# Patient Record
Sex: Female | Born: 1935 | Race: White | Hispanic: No | Marital: Married | Smoking: Never smoker
Health system: Southern US, Community
[De-identification: ages and names within clinical notes are randomized; demographics above are authoritative.]

## PROBLEM LIST (undated history)

## (undated) DIAGNOSIS — R310 Gross hematuria: Secondary | ICD-10-CM

## (undated) DIAGNOSIS — J449 Chronic obstructive pulmonary disease, unspecified: Secondary | ICD-10-CM

## (undated) DIAGNOSIS — R32 Unspecified urinary incontinence: Secondary | ICD-10-CM

## (undated) DIAGNOSIS — Z9889 Other specified postprocedural states: Secondary | ICD-10-CM

## (undated) DIAGNOSIS — R351 Nocturia: Secondary | ICD-10-CM

## (undated) DIAGNOSIS — E785 Hyperlipidemia, unspecified: Secondary | ICD-10-CM

## (undated) DIAGNOSIS — R350 Frequency of micturition: Secondary | ICD-10-CM

## (undated) DIAGNOSIS — Z8719 Personal history of other diseases of the digestive system: Secondary | ICD-10-CM

## (undated) DIAGNOSIS — E119 Type 2 diabetes mellitus without complications: Secondary | ICD-10-CM

## (undated) DIAGNOSIS — Z8554 Personal history of malignant neoplasm of ureter: Secondary | ICD-10-CM

## (undated) DIAGNOSIS — Z8551 Personal history of malignant neoplasm of bladder: Secondary | ICD-10-CM

## (undated) DIAGNOSIS — I10 Essential (primary) hypertension: Secondary | ICD-10-CM

## (undated) DIAGNOSIS — E039 Hypothyroidism, unspecified: Secondary | ICD-10-CM

## (undated) HISTORY — PX: OTHER SURGICAL HISTORY: SHX169

## (undated) HISTORY — PX: CATARACT EXTRACTION W/ INTRAOCULAR LENS  IMPLANT, BILATERAL: SHX1307

---

## 1998-04-17 ENCOUNTER — Ambulatory Visit (HOSPITAL_COMMUNITY): Admission: RE | Admit: 1998-04-17 | Discharge: 1998-04-17 | Payer: Self-pay | Admitting: *Deleted

## 1998-04-17 ENCOUNTER — Encounter: Payer: Self-pay | Admitting: *Deleted

## 1998-04-23 ENCOUNTER — Ambulatory Visit (HOSPITAL_COMMUNITY): Admission: RE | Admit: 1998-04-23 | Discharge: 1998-04-23 | Payer: Self-pay | Admitting: *Deleted

## 1998-04-23 ENCOUNTER — Encounter: Payer: Self-pay | Admitting: *Deleted

## 2001-01-09 ENCOUNTER — Ambulatory Visit (HOSPITAL_COMMUNITY): Admission: RE | Admit: 2001-01-09 | Discharge: 2001-01-09 | Payer: Self-pay | Admitting: General Surgery

## 2001-05-30 ENCOUNTER — Ambulatory Visit (HOSPITAL_COMMUNITY): Admission: RE | Admit: 2001-05-30 | Discharge: 2001-05-30 | Payer: Self-pay | Admitting: Ophthalmology

## 2001-06-23 ENCOUNTER — Ambulatory Visit (HOSPITAL_COMMUNITY): Admission: RE | Admit: 2001-06-23 | Discharge: 2001-06-23 | Payer: Self-pay | Admitting: Internal Medicine

## 2001-06-23 ENCOUNTER — Encounter: Payer: Self-pay | Admitting: Internal Medicine

## 2001-07-11 ENCOUNTER — Ambulatory Visit (HOSPITAL_COMMUNITY): Admission: RE | Admit: 2001-07-11 | Discharge: 2001-07-11 | Payer: Self-pay | Admitting: Ophthalmology

## 2002-06-25 ENCOUNTER — Ambulatory Visit (HOSPITAL_COMMUNITY): Admission: RE | Admit: 2002-06-25 | Discharge: 2002-06-25 | Payer: Self-pay | Admitting: Internal Medicine

## 2002-06-25 ENCOUNTER — Encounter: Payer: Self-pay | Admitting: Internal Medicine

## 2003-07-15 ENCOUNTER — Ambulatory Visit (HOSPITAL_COMMUNITY): Admission: RE | Admit: 2003-07-15 | Discharge: 2003-07-15 | Payer: Self-pay | Admitting: Internal Medicine

## 2004-07-29 ENCOUNTER — Ambulatory Visit (HOSPITAL_COMMUNITY): Admission: RE | Admit: 2004-07-29 | Discharge: 2004-07-29 | Payer: Self-pay | Admitting: Internal Medicine

## 2005-07-30 ENCOUNTER — Ambulatory Visit (HOSPITAL_COMMUNITY): Admission: RE | Admit: 2005-07-30 | Discharge: 2005-07-30 | Payer: Self-pay | Admitting: Internal Medicine

## 2006-08-02 ENCOUNTER — Ambulatory Visit (HOSPITAL_COMMUNITY): Admission: RE | Admit: 2006-08-02 | Discharge: 2006-08-02 | Payer: Self-pay | Admitting: Internal Medicine

## 2006-11-08 ENCOUNTER — Ambulatory Visit (HOSPITAL_COMMUNITY): Admission: RE | Admit: 2006-11-08 | Discharge: 2006-11-08 | Payer: Self-pay | Admitting: General Surgery

## 2006-11-08 ENCOUNTER — Encounter (INDEPENDENT_AMBULATORY_CARE_PROVIDER_SITE_OTHER): Payer: Self-pay | Admitting: General Surgery

## 2007-08-03 ENCOUNTER — Ambulatory Visit (HOSPITAL_COMMUNITY): Admission: RE | Admit: 2007-08-03 | Discharge: 2007-08-03 | Payer: Self-pay | Admitting: Internal Medicine

## 2007-08-14 ENCOUNTER — Ambulatory Visit (HOSPITAL_COMMUNITY): Admission: RE | Admit: 2007-08-14 | Discharge: 2007-08-14 | Payer: Self-pay | Admitting: Internal Medicine

## 2008-08-14 ENCOUNTER — Ambulatory Visit (HOSPITAL_COMMUNITY): Admission: RE | Admit: 2008-08-14 | Discharge: 2008-08-14 | Payer: Self-pay | Admitting: Internal Medicine

## 2009-08-18 ENCOUNTER — Ambulatory Visit (HOSPITAL_COMMUNITY)
Admission: RE | Admit: 2009-08-18 | Discharge: 2009-08-18 | Payer: Self-pay | Source: Home / Self Care | Admitting: Internal Medicine

## 2010-04-28 ENCOUNTER — Ambulatory Visit (HOSPITAL_COMMUNITY)
Admission: RE | Admit: 2010-04-28 | Discharge: 2010-04-28 | Payer: Self-pay | Source: Home / Self Care | Attending: Internal Medicine | Admitting: Internal Medicine

## 2010-05-01 ENCOUNTER — Ambulatory Visit
Admission: RE | Admit: 2010-05-01 | Discharge: 2010-05-01 | Payer: Self-pay | Source: Home / Self Care | Attending: Urology | Admitting: Urology

## 2010-05-19 ENCOUNTER — Other Ambulatory Visit: Payer: Self-pay | Admitting: Urology

## 2010-05-19 ENCOUNTER — Ambulatory Visit (HOSPITAL_COMMUNITY)
Admission: RE | Admit: 2010-05-19 | Discharge: 2010-05-19 | Disposition: A | Discharge status: Home / Self Care | Payer: Medicare Other | Source: Ambulatory Visit | Attending: Urology | Admitting: Urology

## 2010-05-19 ENCOUNTER — Ambulatory Visit (HOSPITAL_BASED_OUTPATIENT_CLINIC_OR_DEPARTMENT_OTHER)
Admission: RE | Admit: 2010-05-19 | Discharge: 2010-05-19 | Disposition: A | Discharge status: Home / Self Care | Payer: Medicare Other | Source: Ambulatory Visit | Attending: Urology | Admitting: Urology

## 2010-05-19 DIAGNOSIS — C669 Malignant neoplasm of unspecified ureter: Secondary | ICD-10-CM | POA: Insufficient documentation

## 2010-05-19 DIAGNOSIS — Z01812 Encounter for preprocedural laboratory examination: Secondary | ICD-10-CM | POA: Insufficient documentation

## 2010-05-19 DIAGNOSIS — Z01818 Encounter for other preprocedural examination: Secondary | ICD-10-CM

## 2010-05-19 DIAGNOSIS — Z01810 Encounter for preprocedural cardiovascular examination: Secondary | ICD-10-CM | POA: Insufficient documentation

## 2010-05-19 HISTORY — PX: OTHER SURGICAL HISTORY: SHX169

## 2010-05-19 LAB — POCT I-STAT 4, (NA,K, GLUC, HGB,HCT): Glucose, Bld: 126 mg/dL — ABNORMAL HIGH (ref 70–99)

## 2010-05-20 NOTE — Op Note (Addendum)
NAMECAMIRA, [name]                  ACCOUNT NO.:  [id number]  MEDICAL RECORD NO.:  [id number]           PATIENT TYPE:  O  LOCATION:  XRAY                         FACILITY:  [facility]  PHYSICIAN:  Excell Seltzer. Annabell Howells, M.D.    DATE OF BIRTH:  Dec 29, 1922  DATE OF PROCEDURE:  05/19/2010 DATE OF DISCHARGE:                              OPERATIVE REPORT   PROCEDURE:  Cystoscopy, left retrograde pyelogram with interpretation, left ureteroscopy with biopsy and fulguration of 2-cm left distal ureteral tumor, aspiration cytology from the left renal pelvis, placement of a left double-J stent.  SURGEON:  Excell Seltzer. Annabell Howells, M.D.  ANESTHESIA:  MAC.  FINDINGS:  A 2-cm papillary left distal ureteral tumor with a normal left renal retrograde pyelogram.  SPECIMEN:  Left renal cytology and left distal tumor fragments sent to Pathology.  ESTIMATED BLOOD LOSS:  Minimal.  DRAINS:  6-French 24-cm double-J stent.  COMPLICATIONS:  None.  INDICATIONS:  [name] is an 77 year old white female who was evaluated for hematuria and was found to have a left distal ureteral filling defect, worrisome for clot or tumor.  The bleeding has stopped, but she still needs evaluation.  FINDINGS AND PROCEDURE:  She was taken to the operating room where she was given Cipro and fitted with PAS hose.  Latex precautions were enacted.  Moderate anesthesia care was induced.  She was placed in the lithotomy position.  Her perineum and genitalia were prepped with Betadine solution.  She was draped in the usual sterile fashion.  Time- out was performed.  The urethra was instilled with 2% lidocaine jelly. Cystoscopy was performed using the 22-French scope and 12-degree and 70- degrees lenses.  Examination revealed a normal urethra.  The bladder wall was smooth and pale without tumor, stones or inflammation. Ureteral orifices were unremarkable.  The left ureteral orifice was cannulated with a 5-French open-ended catheter and contrast  was instilled.  This revealed a filling defect in the upper portion of the distal ureter approximately 2 cm in length, consistent with the finding on CT scan and worrisome for tumor.  The more proximal ureter and internal collecting system were unremarkable.  A 6-French ureteroscope was then passed per the ureteral orifice without difficulty over a wire and then the ureteroscope was backed out and inserted alongside the wire.  Inspection revealed a papillary tumor in the upper portion of the distal ureter, as indicated by the retrograde pyelogram.  It did not appear to have a single stalk and was more of a carpet-like encircling of the ureter with some larger components.  A more proximal ureteroscopy to the level of the kidney demonstrated no obvious tumors in this area.  An attempt was made to place the 6-French flexible ureteroscope to the kidney, but there was some narrowing in the proximal ureter that precluded placement.  I did not feel that dilation was absolutely indicated in this situation since the retrograde was normal and the CT was normal.  However, I did insert a 5-French open- ended catheter to the kidney after this and obtained saline barbotage renal pelvic cytologies.  At this point, the  6-French short ureteroscope was reinserted and initially an escape basket was used to remove several fragments of the tumor mass to send for pathologic anastomosis.  Once the tumor had been debulked with the basket, a Bugbee electrode was placed with the setting on 10 coagulation, zero cut and the tumor was fulgurated as completely as possible.  However, due to the diffuse nature of the tumor, I did not feel that I had fulgurated the entire tumor but had done all that I could at this particular setting.  At this point, the cystoscope was reinserted over the guidewire and a 6- [place] 24-cm double-J stent was placed in the kidney without difficulty. The wire was removed leaving a good  coil in the kidney and a good coil in the bladder.  The bladder was drained.  The patient was taken down from the lithotomy position.  Her anesthetic was reversed and she was moved to the recovery room in stable condition.  There were no complications.     Excell Seltzer. Annabell Howells, M.D.     JJW/MEDQ  D:  05/19/2010  T:  05/19/2010  Job:  [id number]  Electronically Signed by Bjorn Pippin M.D. on 05/20/2010 09:25:31 AM

## 2010-05-21 LAB — POCT I-STAT 4, (NA,K, GLUC, HGB,HCT)
Hemoglobin: 8.8 g/dL — ABNORMAL LOW (ref 12.0–15.0)
Sodium: 111 mEq/L — CL (ref 135–145)

## 2010-05-29 ENCOUNTER — Ambulatory Visit (INDEPENDENT_AMBULATORY_CARE_PROVIDER_SITE_OTHER): Payer: Medicare Other | Admitting: Urology

## 2010-05-29 DIAGNOSIS — C669 Malignant neoplasm of unspecified ureter: Secondary | ICD-10-CM

## 2010-06-23 ENCOUNTER — Ambulatory Visit (HOSPITAL_BASED_OUTPATIENT_CLINIC_OR_DEPARTMENT_OTHER)
Admission: RE | Admit: 2010-06-23 | Discharge: 2010-06-23 | Disposition: A | Discharge status: Home / Self Care | Payer: Medicare Other | Source: Ambulatory Visit | Attending: Urology | Admitting: Urology

## 2010-06-23 DIAGNOSIS — E119 Type 2 diabetes mellitus without complications: Secondary | ICD-10-CM | POA: Insufficient documentation

## 2010-06-23 DIAGNOSIS — C669 Malignant neoplasm of unspecified ureter: Secondary | ICD-10-CM | POA: Insufficient documentation

## 2010-06-23 DIAGNOSIS — Z79899 Other long term (current) drug therapy: Secondary | ICD-10-CM | POA: Insufficient documentation

## 2010-06-23 DIAGNOSIS — E039 Hypothyroidism, unspecified: Secondary | ICD-10-CM | POA: Insufficient documentation

## 2010-06-23 DIAGNOSIS — E7800 Pure hypercholesterolemia, unspecified: Secondary | ICD-10-CM | POA: Insufficient documentation

## 2010-06-23 DIAGNOSIS — Z01812 Encounter for preprocedural laboratory examination: Secondary | ICD-10-CM | POA: Insufficient documentation

## 2010-06-23 DIAGNOSIS — I10 Essential (primary) hypertension: Secondary | ICD-10-CM | POA: Insufficient documentation

## 2010-06-23 LAB — POCT I-STAT 4, (NA,K, GLUC, HGB,HCT): Sodium: 140 mEq/L (ref 135–145)

## 2010-06-23 LAB — GLUCOSE, CAPILLARY: Glucose-Capillary: 114 mg/dL — ABNORMAL HIGH (ref 70–99)

## 2010-06-24 HISTORY — PX: OTHER SURGICAL HISTORY: SHX169

## 2010-06-24 NOTE — Op Note (Signed)
[name], [name]                  ACCOUNT NO.:  [id number]  MEDICAL RECORD NO.:  [id number]           PATIENT TYPE:  LOCATION:                                 FACILITY:  PHYSICIAN:  [name], M.D.    DATE OF BIRTH:  13-Jun-1922  DATE OF PROCEDURE: DATE OF DISCHARGE:                              OPERATIVE REPORT   PROCEDURE:  Left ureteroscopy with fulguration of tumor.  Cystoscopy with removal and replacement of left double-J stent.  PREOPERATIVE DIAGNOSIS:  Left ureteral tumor.  POSTOPERATIVE DIAGNOSIS:  Left ureteral tumor.  SURGEON:  Paula Seltzer. Paula Howells, MD  ANESTHESIA:  MAC.  DRAINS:  6-French 24-cm double-J stent.  SPECIMEN:  None.  COMPLICATIONS:  None.  INDICATIONS:  [name] is an 77 year old white female who was initially evaluated for hematuria and was found to have a filling defect in the left distal ureter.  Initial ureteroscopy demonstrated a papillary tumor in the distal ureter, which on biopsy was found to be a urothelial tumor of low malignant potential.  She had initially undergone extensive biopsy and fulguration with collection of cytologies from the renal pelvis.  The cytologies from the renal pelvis returned consistent with low-grade urothelial tumor and it was felt that a second-look procedure was necessary to ensure complete fulguration of the distal tumor and to inspect the renal collecting system.  FINDINGS AND PROCEDURE:  She was given Cipro and taken to the operating room where she was placed in lithotomy position.  PAS hose were fitted. Sedation was induced.  Her genitalia was prepped with Betadine solution and she was draped in usual sterile fashion.  Latex precautions were observed.  The 22-French cystoscope with a 12-degree lens was inserted.  The stent was visualized, it was grasped with a grasping forceps and pulled the urethral meatus.  A sensor guidewire was passed through the stent to the kidney under fluoroscopic guidance and  the stent was removed.  The 6-French short ureteroscope was then inserted alongside the wire using saline initially as irrigant.  Examination revealed minimal residual tumor in the area of the distal ureter, but there were 2 small papillary fronds apparent.  The ureteroscope was then advanced to the level of the renal pelvis with no additional tumors noted in the ureter or renal pelvis.  A 14-French access sheath was then inserted easily over the wire to just below the kidney and flexible ureteroscope was then advanced through the access sheath into the kidney.  Inspection of the entire caliceal system, which was assured by using instillation of contrast to identify the anatomy demonstrated some stent trauma, but no evidence of papillary urothelial tumors.  No stones were seen as well.  Once the collecting system had been inspected, the access sheath was removed along with ureteroscope after re-insertion of the wire.  The irrigant was changed to sterile water and the short ureteroscope was then advanced to the level of the tumor in the distal ureter.  A Bugbee electrode was then used to fulgurate the residual abnormal papillary tissue.  Once the fulguration had been complete, the ureteroscope was removed. Minimal sterile  water had been used as irrigant.  A 6-French 24 cm double-J stent was then inserted over the wire and advanced to the kidney under fluoroscopic guidance.  Once in good position, the wire was removed, leaving a good coil in the kidney and a good coil in the bladder.  This was confirmed both fluoroscopically and endoscopically. The bladder was then drained.  She was taken down from lithotomy position.  Her anesthetic was reversed.  She was moved to the recovery room in stable condition.  She will be sent home with prescriptions for Vicodin and Cipro and will follow up with me on July 03, 2010, for removal of her stent.  She will need subsequent  surveillance ureteroscopy in approximately 3 months.     [name], M.D.     [name]  D:  06/23/2010  T:  06/23/2010  Job:  [id number]  cc:   [name]. Paula Sills, MD Fax: [phone]  Electronically Signed by [name] M.D. on 06/24/2010 04:51:40 PM

## 2010-07-03 ENCOUNTER — Other Ambulatory Visit: Payer: Self-pay | Admitting: Urology

## 2010-07-03 ENCOUNTER — Ambulatory Visit (INDEPENDENT_AMBULATORY_CARE_PROVIDER_SITE_OTHER): Payer: Medicare Other | Admitting: Urology

## 2010-07-03 DIAGNOSIS — C669 Malignant neoplasm of unspecified ureter: Secondary | ICD-10-CM

## 2010-07-03 DIAGNOSIS — Z79899 Other long term (current) drug therapy: Secondary | ICD-10-CM

## 2010-07-03 DIAGNOSIS — C801 Malignant (primary) neoplasm, unspecified: Secondary | ICD-10-CM

## 2010-07-07 ENCOUNTER — Ambulatory Visit (HOSPITAL_COMMUNITY)
Admission: RE | Admit: 2010-07-07 | Discharge: 2010-07-07 | Disposition: A | Discharge status: Home / Self Care | Payer: Medicare Other | Source: Ambulatory Visit | Attending: Urology | Admitting: Urology

## 2010-07-07 DIAGNOSIS — Z8559 Personal history of malignant neoplasm of other urinary tract organ: Secondary | ICD-10-CM | POA: Insufficient documentation

## 2010-07-07 DIAGNOSIS — C669 Malignant neoplasm of unspecified ureter: Secondary | ICD-10-CM

## 2010-07-07 DIAGNOSIS — R9389 Abnormal findings on diagnostic imaging of other specified body structures: Secondary | ICD-10-CM | POA: Insufficient documentation

## 2010-07-21 ENCOUNTER — Other Ambulatory Visit (HOSPITAL_COMMUNITY): Payer: Self-pay | Admitting: Internal Medicine

## 2010-07-21 DIAGNOSIS — Z139 Encounter for screening, unspecified: Secondary | ICD-10-CM

## 2010-08-07 ENCOUNTER — Ambulatory Visit (INDEPENDENT_AMBULATORY_CARE_PROVIDER_SITE_OTHER): Payer: Medicare Other | Admitting: Urology

## 2010-08-07 DIAGNOSIS — C669 Malignant neoplasm of unspecified ureter: Secondary | ICD-10-CM

## 2010-08-12 ENCOUNTER — Encounter (HOSPITAL_COMMUNITY): Payer: Medicare Other | Attending: Urology

## 2010-08-12 ENCOUNTER — Other Ambulatory Visit: Payer: Self-pay | Admitting: Urology

## 2010-08-12 DIAGNOSIS — Z01812 Encounter for preprocedural laboratory examination: Secondary | ICD-10-CM | POA: Insufficient documentation

## 2010-08-12 DIAGNOSIS — Z09 Encounter for follow-up examination after completed treatment for conditions other than malignant neoplasm: Secondary | ICD-10-CM | POA: Insufficient documentation

## 2010-08-12 DIAGNOSIS — Z8559 Personal history of malignant neoplasm of other urinary tract organ: Secondary | ICD-10-CM | POA: Insufficient documentation

## 2010-08-12 DIAGNOSIS — Z538 Procedure and treatment not carried out for other reasons: Secondary | ICD-10-CM | POA: Insufficient documentation

## 2010-08-12 LAB — CBC
HCT: 38.3 % (ref 36.0–46.0)
Hemoglobin: 12.5 g/dL (ref 12.0–15.0)
MCH: 27.8 pg (ref 26.0–34.0)
RBC: 4.49 MIL/uL (ref 3.87–5.11)
RDW: 14.1 % (ref 11.5–15.5)
WBC: 10 10*3/uL (ref 4.0–10.5)

## 2010-08-12 LAB — BASIC METABOLIC PANEL
Calcium: 10.5 mg/dL (ref 8.4–10.5)
Chloride: 99 mEq/L (ref 96–112)
Creatinine, Ser: 1.08 mg/dL (ref 0.4–1.2)
GFR calc Af Amer: 58 mL/min — ABNORMAL LOW (ref >60)
Glucose, Bld: 132 mg/dL — ABNORMAL HIGH (ref 70–99)

## 2010-08-12 LAB — SURGICAL PCR SCREEN
MRSA, PCR: NEGATIVE
Staphylococcus aureus: NEGATIVE

## 2010-08-18 NOTE — H&P (Signed)
NAMEEZMA, REHM                  ACCOUNT NO.:  [id number]   MEDICAL RECORD NO.:  [id number]          PATIENT TYPE:  AMB   LOCATION:  DAY                           FACILITY:  [facility]   PHYSICIAN:  Dalia Heading, M.D.  DATE OF BIRTH:  1923-02-06   DATE OF ADMISSION:  DATE OF DISCHARGE:  LH                              HISTORY & PHYSICAL   CHIEF COMPLAINT:  Colon polyps.   HISTORY OF PRESENT ILLNESS:  This patient is an 77 year old white female  who is referred for endoscopic evaluation.  She needs a colonoscopy for  followup of colon polyps.  She last had a colonoscopy in 2002.  No  abdominal pain, weight loss, nausea, vomiting, diarrhea, constipation,  melena, hematochezia had been noted.  There is no family history of  colon carcinoma.   PAST MEDICAL HISTORY:  Hypothyroidism, hypertension, high cholesterol  levels.   PAST SURGICAL HISTORY:  Colonoscopy in 2002.   CURRENT MEDICATIONS:  1. Caltrate.  2. Potassium.  3. Baby aspirin.  4. Levoxyl.  5. Metoprolol.  6. Hydrochlorothiazide.  7. Caduet.   ALLERGIES:  No known drug allergies.   REVIEW OF SYSTEMS:  Noncontributory.   PHYSICAL EXAMINATION:  Patient is a well-developed, well-nourished white  female in no acute distress.  Lungs:  Clear to auscultation with equal  breath sounds bilaterally.  Heart examination:  Reveals regular rate and  rhythm without S3, S4, murmurs.  Abdomen:  Soft, nontender,  nondistended.  No hepatosplenomegaly or masses are noted.  Rectal  examination:  Deferred due to procedure.   IMPRESSION:  History of colon polyps.   PLAN:  The patient is scheduled for a colonoscopy on November 08, 2006.  The risks and benefits of the procedure including bleeding and  perforation were fully explained to the patient.  Gave informed consent.      Dalia Heading, M.D.  Electronically Signed     MAJ/MEDQ  D:  11/03/2006  T:  11/04/2006  Job:  [id number]   cc:   Kingsley Callander. Ouida Sills, MD  Fax: [phone]

## 2010-08-21 ENCOUNTER — Ambulatory Visit (HOSPITAL_COMMUNITY)
Admission: RE | Admit: 2010-08-21 | Discharge: 2010-08-21 | Disposition: A | Discharge status: Home / Self Care | Payer: Medicare Other | Source: Ambulatory Visit | Attending: Internal Medicine | Admitting: Internal Medicine

## 2010-08-21 DIAGNOSIS — Z1231 Encounter for screening mammogram for malignant neoplasm of breast: Secondary | ICD-10-CM | POA: Insufficient documentation

## 2010-08-21 DIAGNOSIS — Z139 Encounter for screening, unspecified: Secondary | ICD-10-CM

## 2010-08-21 NOTE — H&P (Signed)
[facility]  Patient:    [name], [name] Visit Number: [id number] MRN: [id number]          Service Type: Attending:  Franky Macho, M.D. Dictated by:   Franky Macho, M.D. Adm. Date:  01/09/01   CC:         Carylon Perches, M.D.   History and Physical  DATE OF BIRTH: 1922/10/26  CHIEF COMPLAINT: Colon polyps.  HISTORY OF PRESENT ILLNESS: The patient is a 77 year old white female, referred for colonoscopy.  She had a colonoscopy with polypectomy three years ago by a gastroenterologist.  She is now here for follow-up colonoscopy.  She denies any light headedness, weight loss, fever, abdominal pain, diarrhea, constipation, hematochezia, or melena.  She denies any hemorrhoidal problems or family history of colon carcinoma.  PAST MEDICAL HISTORY: Includes hypothyroidism, high cholesterol levels, hypertension.  PAST SURGICAL HISTORY: Colonoscopy, valve from kidney.  CURRENT MEDICATIONS:  1. Klor-Con.  2. Caltrate.  3. Aspirin.  4. Levoxyl.  5. Lipitor.  6. Hydrochlorothiazide.  7. Toprol XL.  8. Norvasc.  ALLERGIES: No known drug allergies.  SOCIAL HISTORY: The patient denies drinking or smoking.  REVIEW OF SYSTEMS: She denies any recent chest pain, MI, shortness of breath, leg swelling, CVA, or diabetes mellitus.  She denies any bleeding disorder.  PHYSICAL EXAMINATION:  GENERAL: The patient is a well-developed, well-nourished white female, in no acute distress.  VITAL SIGNS: She is afebrile and vital signs are stable.  LUNGS: Clear to auscultation with equal breath sounds bilaterally.  HEART: Regular rate and rhythm without S3, S4, or murmurs.  ABDOMEN: Soft, nontender, nondistended.  No hepatosplenomegaly or masses are noted.  RECTAL: Examination deferred to the procedure.  IMPRESSION: History of colon polyps.  PLAN: The patient is scheduled for colonoscopy on January 09, 2001.  The risks and benefits of the procedure including  bleeding and perforation were fully explained to the patient, who gave informed consent.  CoLyte preparation has been prescribed. Dictated by:   Franky Macho, M.D. Attending:  Franky Macho, M.D. DD:  01/05/01 TD:  01/06/01 Job: [id number]

## 2010-09-11 ENCOUNTER — Other Ambulatory Visit (HOSPITAL_COMMUNITY): Payer: Medicare Other

## 2010-09-18 ENCOUNTER — Ambulatory Visit (HOSPITAL_COMMUNITY): Payer: Medicare Other

## 2010-09-18 ENCOUNTER — Ambulatory Visit (HOSPITAL_COMMUNITY)
Admission: RE | Admit: 2010-09-18 | Discharge: 2010-09-18 | Disposition: A | Discharge status: Home / Self Care | Payer: Medicare Other | Source: Ambulatory Visit | Attending: Urology | Admitting: Urology

## 2010-09-18 DIAGNOSIS — Z8559 Personal history of malignant neoplasm of other urinary tract organ: Secondary | ICD-10-CM | POA: Insufficient documentation

## 2010-09-18 DIAGNOSIS — E119 Type 2 diabetes mellitus without complications: Secondary | ICD-10-CM | POA: Insufficient documentation

## 2010-09-18 DIAGNOSIS — Z09 Encounter for follow-up examination after completed treatment for conditions other than malignant neoplasm: Secondary | ICD-10-CM | POA: Insufficient documentation

## 2010-09-18 DIAGNOSIS — Z7982 Long term (current) use of aspirin: Secondary | ICD-10-CM | POA: Insufficient documentation

## 2010-09-18 DIAGNOSIS — Z01812 Encounter for preprocedural laboratory examination: Secondary | ICD-10-CM | POA: Insufficient documentation

## 2010-09-18 DIAGNOSIS — I10 Essential (primary) hypertension: Secondary | ICD-10-CM | POA: Insufficient documentation

## 2010-09-18 LAB — SURGICAL PCR SCREEN: MRSA, PCR: NEGATIVE

## 2010-10-04 NOTE — Op Note (Signed)
  NAMEKHAI, TORBERT                  ACCOUNT NO.:  [id number]  MEDICAL RECORD NO.:  [id number]  LOCATION:  DAYP                          FACILITY:  [facility]  PHYSICIAN:  Excell Seltzer. Annabell Howells, M.D.    DATE OF BIRTH:  Apr 24, 1922  DATE OF PROCEDURE:  09/18/2010 DATE OF DISCHARGE:                              OPERATIVE REPORT   PROCEDURES: 1. Cystoscopy. 2. Left retrograde pyelogram with interpretation. 3. Left ureteroscopy.  PREOPERATIVE DIAGNOSIS:  History of left distal ureteral tumor.  POSTOPERATIVE DIAGNOSIS:  History of left distal ureteral tumor with no evidence of recurrence.  SURGEON:  Excell Seltzer. Annabell Howells, MD  ANESTHESIA:  General.  SPECIMEN:  None.  DRAINS:  None.  COMPLICATIONS:  None.  INDICATIONS:  Ms. Greeson is an 77 year old white female who underwent a biopsy and laser fulguration of a distal left ureteral tumor of low malignant potential.  She returns today for followup endoscopy.  FINDINGS OF PROCEDURE:  She was given Cipro.  She was taken to the operating room where general anesthetic was induced.  She was placed in the lithotomy position.  Her perineum and genitalia were prepped with Betadine solution.  She was draped in usual sterile fashion.  Cystoscopy was performed using the 22-French scope.  A 12-degree lens examination revealed a normal urethra.  The bladder wall had mild trabeculation.  No tumor, stones, or inflammation were noted.  Ureteral orifices were unremarkable.  Left ureteral orifice was cannulated with 5-French open-end catheter. Contrast was instilled.  This revealed a normal ureter and internal collecting system.  A guidewire was passed to the kidney and the ureteroscope was then inserted without difficulty through the left ureteral orifice. Inspection of the ureter to the mid proximal level revealed no evidence of recurrent tumors.  The ureter was remarkably normal in appearance.  At this point, the ureteroscope was removed and bladder was  drained. The patient was taken down from lithotomy position.  Her anesthetic was reversed.  She was moved to the recovery room in stable condition. There were no complications.     Excell Seltzer. Annabell Howells, M.D.     JJW/MEDQ  D:  09/18/2010  T:  09/18/2010  Job:  [id number]  Electronically Signed by Bjorn Pippin M.D. on 10/04/2010 12:08:25 PM

## 2010-10-09 ENCOUNTER — Ambulatory Visit (INDEPENDENT_AMBULATORY_CARE_PROVIDER_SITE_OTHER): Payer: Medicare Other | Admitting: Urology

## 2010-10-09 DIAGNOSIS — Z8559 Personal history of malignant neoplasm of other urinary tract organ: Secondary | ICD-10-CM

## 2011-04-02 ENCOUNTER — Ambulatory Visit (INDEPENDENT_AMBULATORY_CARE_PROVIDER_SITE_OTHER): Payer: Medicare Other | Admitting: Urology

## 2011-04-02 DIAGNOSIS — Z8559 Personal history of malignant neoplasm of other urinary tract organ: Secondary | ICD-10-CM

## 2011-04-13 ENCOUNTER — Other Ambulatory Visit: Payer: Self-pay | Admitting: Urology

## 2011-05-11 ENCOUNTER — Encounter (HOSPITAL_BASED_OUTPATIENT_CLINIC_OR_DEPARTMENT_OTHER): Payer: Self-pay | Admitting: *Deleted

## 2011-05-11 NOTE — Progress Notes (Signed)
To [facility] at 0615. Istat on arrival. Ekg,Cxr with chart. NPO after MN-will take amlodipine,metoprolol,levothyroxine with small amt water only that am.

## 2011-05-17 NOTE — H&P (Signed)
Active Problems 1. 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  She has a history of a left distal ureteral tumor that was low grade and non-invasive and was managed with local ablation in 3/12.  Her surveillance ureteroscopy in 6/12 was negative and a cytology prior to this visit was negative.   She is doing well without hematuria or flank pain.   Past Medical History 1. History of  Diabetes Mellitus 250.00 2. History of  Gross Hematuria 599.71 3. History of  Hypercholesterolemia 272.0 4. History of  Hypertension 401.9 5. History of  Hypothyroidism 244.9 6. 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 History of  Pyuria 791.9 8. 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 History of  Cystoscopy With Insertion Of Ureteral Stent Left 2. History of  Cystoscopy With Insertion Of Ureteral Stent Left 3. History of  Cystoscopy With Resection Of Tumor 4. History of  Cystoscopy With Ureteroscopy For Biopsy Left 5. History of  Cystoscopy With Ureteroscopy Left 6. History of  No Surgical Problems  Current Meds 1. AmLODIPine Besylate 10 MG Oral Tablet; Therapy: (Recorded:27Jan2012) to 2. Aspirin Low Dose 81 MG Oral Tablet; Therapy: (Recorded:27Jan2012) to 3. Caltrate 600+D TABS; Therapy: (Recorded:27Jan2012) to 4. Hydrochlorothiazide 25 MG Oral Tablet; Therapy: (Recorded:27Jan2012) to 5. Klor-Con 20 MEQ Oral Packet; Therapy: (Recorded:27Jan2012) to 6. Levothyroxine Sodium 50 MCG Oral Tablet; Therapy: (Recorded:27Jan2012) to 7. Metoprolol Succinate ER 50 MG Oral Tablet Extended Release 24 Hour; Therapy:  (Recorded:27Jan2012) to 8. Simvastatin 20 MG Oral Tablet; Therapy: (Recorded:27Jan2012) to  Allergies 1. No Known Drug Allergies  Family History 1. Family history of  Family Health Status Number Of Children  Social History 1. Caffeine Use 1 coffee per Day 2.  Marital History - Currently Married 3. Never A Smoker 4. Retired From Work Denied  5. History of  Alcohol Use 6. History of  Tobacco Use  Review of Systems  Genitourinary: urinary frequency and nocturia, but no hematuria.  Gastrointestinal: no abdominal pain.  Constitutional: no fever and no recent weight loss.  Cardiovascular: no chest pain.  Respiratory: no shortness of breath.    Vitals Vital Signs [Data Includes: Last 1 Day]  28Dec2012 01:12PM  Blood Pressure: 133 / 52 Temperature: 97.9 F Heart Rate: 66  Physical Exam Constitutional: Well nourished and well developed . No acute distress.  Pulmonary: No respiratory distress and normal respiratory rhythm and effort.  Cardiovascular: Heart rate and rhythm are normal . No peripheral edema.    Results/Data Urine [Data Includes: Last 1 Day]   28Dec2012  COLOR YELLOW   APPEARANCE CLEAR   SPECIFIC GRAVITY 1.015   pH 5.0   GLUCOSE NEG mg/dL  BILIRUBIN NEG   KETONE NEG mg/dL  BLOOD TRACE   PROTEIN NEG mg/dL  UROBILINOGEN 0.2 mg/dL  NITRITE NEG   LEUKOCYTE ESTERASE NEG   SQUAMOUS EPITHELIAL/HPF NONE SEEN   WBC NONE SEEN WBC/hpf  RBC 0-3 RBC/hpf  BACTERIA NONE SEEN   CRYSTALS NONE SEEN   CASTS NONE SEEN   Other UNSPUN, SAVING VOLUME FOR CYTOLOGY.   Selected Results  URINE CYTOLOGY [facility] FISH 14Dec2012 02:19PM Bjorn Pippin  [id number]   Test Name Result Flag Reference  FINAL DIAGNOSIS:     - NO MALIGNANT CELLS IDENTIFIED. Squamous cells are present.  1 Container Submitted  SOURCE: Urine: Voided    100CC CLEAR YELLOW VURI 1 SLIDE  PREPARED EM [id number]  HIST. NEOPLASM OF URETER  CYTOTECHNOLOGIST:     Alona Bene L. Truitt; BS, CT(ASCP)  PATHOLOGIST:     Reviewed by Pecolia Ades. Deno Etienne, M.D. (Electronic Signature on File)   Assessment 1. 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   Plan PMH: Malignant Neoplasm Of The Ureter (V10.59)    1. UA With REFLEX  Done: 28Dec2012 01:12PM   I am going to set her up for repeat left RTG with ureteroscopy and cystoscopy.   I have reviewed the risks including bleeding, infection, ureteral injury, need for a stent, thrombotic events and anesthetic risks.

## 2011-05-18 ENCOUNTER — Encounter (HOSPITAL_BASED_OUTPATIENT_CLINIC_OR_DEPARTMENT_OTHER)
Admission: RE | Disposition: A | Discharge status: Home / Self Care | Payer: Self-pay | Source: Ambulatory Visit | Attending: Urology

## 2011-05-18 ENCOUNTER — Other Ambulatory Visit: Payer: Self-pay | Admitting: Urology

## 2011-05-18 ENCOUNTER — Encounter (HOSPITAL_BASED_OUTPATIENT_CLINIC_OR_DEPARTMENT_OTHER): Payer: Self-pay | Admitting: *Deleted

## 2011-05-18 ENCOUNTER — Ambulatory Visit (HOSPITAL_BASED_OUTPATIENT_CLINIC_OR_DEPARTMENT_OTHER)
Admission: RE | Admit: 2011-05-18 | Discharge: 2011-05-18 | Disposition: A | Discharge status: Home / Self Care | Payer: Medicare Other | Source: Ambulatory Visit | Attending: Urology | Admitting: Urology

## 2011-05-18 ENCOUNTER — Ambulatory Visit (HOSPITAL_BASED_OUTPATIENT_CLINIC_OR_DEPARTMENT_OTHER): Payer: Medicare Other | Admitting: Anesthesiology

## 2011-05-18 ENCOUNTER — Other Ambulatory Visit: Payer: Self-pay

## 2011-05-18 ENCOUNTER — Encounter (HOSPITAL_BASED_OUTPATIENT_CLINIC_OR_DEPARTMENT_OTHER): Payer: Self-pay | Admitting: Anesthesiology

## 2011-05-18 DIAGNOSIS — D4959 Neoplasm of unspecified behavior of other genitourinary organ: Secondary | ICD-10-CM | POA: Insufficient documentation

## 2011-05-18 DIAGNOSIS — E119 Type 2 diabetes mellitus without complications: Secondary | ICD-10-CM | POA: Insufficient documentation

## 2011-05-18 DIAGNOSIS — E7800 Pure hypercholesterolemia, unspecified: Secondary | ICD-10-CM | POA: Insufficient documentation

## 2011-05-18 DIAGNOSIS — I10 Essential (primary) hypertension: Secondary | ICD-10-CM | POA: Insufficient documentation

## 2011-05-18 DIAGNOSIS — R310 Gross hematuria: Secondary | ICD-10-CM | POA: Insufficient documentation

## 2011-05-18 DIAGNOSIS — E039 Hypothyroidism, unspecified: Secondary | ICD-10-CM | POA: Insufficient documentation

## 2011-05-18 DIAGNOSIS — D494 Neoplasm of unspecified behavior of bladder: Secondary | ICD-10-CM | POA: Insufficient documentation

## 2011-05-18 DIAGNOSIS — Z7982 Long term (current) use of aspirin: Secondary | ICD-10-CM | POA: Insufficient documentation

## 2011-05-18 DIAGNOSIS — R82998 Other abnormal findings in urine: Secondary | ICD-10-CM | POA: Insufficient documentation

## 2011-05-18 DIAGNOSIS — C669 Malignant neoplasm of unspecified ureter: Secondary | ICD-10-CM | POA: Insufficient documentation

## 2011-05-18 DIAGNOSIS — Z79899 Other long term (current) drug therapy: Secondary | ICD-10-CM | POA: Insufficient documentation

## 2011-05-18 HISTORY — DX: Chronic obstructive pulmonary disease, unspecified: J44.9

## 2011-05-18 HISTORY — DX: Essential (primary) hypertension: I10

## 2011-05-18 HISTORY — DX: Hyperlipidemia, unspecified: E78.5

## 2011-05-18 HISTORY — DX: Hypothyroidism, unspecified: E03.9

## 2011-05-18 HISTORY — DX: Unspecified urinary incontinence: R32

## 2011-05-18 HISTORY — DX: Frequency of micturition: R35.0

## 2011-05-18 LAB — GLUCOSE, CAPILLARY: Glucose-Capillary: 102 mg/dL — ABNORMAL HIGH (ref 70–99)

## 2011-05-18 LAB — POCT I-STAT 4, (NA,K, GLUC, HGB,HCT)
Glucose, Bld: 108 mg/dL — ABNORMAL HIGH (ref 70–99)
HCT: 40 % (ref 36.0–46.0)
Sodium: 140 mEq/L (ref 135–145)

## 2011-05-18 SURGERY — CYSTOSCOPY/RETROGRADE/URETEROSCOPY
Anesthesia: General | Site: Ureter | Wound class: Clean Contaminated

## 2011-05-18 MED ORDER — CIPROFLOXACIN IN D5W 400 MG/200ML IV SOLN
400.0000 mg | INTRAVENOUS | Status: AC
  Administered 2011-05-18: 400 mg via INTRAVENOUS

## 2011-05-18 MED ORDER — IOHEXOL 350 MG/ML SOLN
INTRAVENOUS | Status: DC | PRN
  Administered 2011-05-18: 50 mL

## 2011-05-18 MED ORDER — OXYCODONE HCL 5 MG PO TABS: 5.0000 mg | ORAL_TABLET | ORAL | Status: DC | PRN

## 2011-05-18 MED ORDER — PHENAZOPYRIDINE HCL 200 MG PO TABS: 200.0000 mg | ORAL_TABLET | Freq: Three times a day (TID) | ORAL | Status: AC | PRN

## 2011-05-18 MED ORDER — LACTATED RINGERS IV SOLN
INTRAVENOUS | Status: DC
  Administered 2011-05-18: 07:00:00 via INTRAVENOUS
  Administered 2011-05-18: 100 mL/h via INTRAVENOUS

## 2011-05-18 MED ORDER — SODIUM CHLORIDE 0.9 % IR SOLN
Status: DC | PRN
  Administered 2011-05-18: 3000 mL

## 2011-05-18 MED ORDER — FENTANYL CITRATE 0.05 MG/ML IJ SOLN
INTRAMUSCULAR | Status: DC | PRN
  Administered 2011-05-18: 50 ug via INTRAVENOUS
  Administered 2011-05-18 (×2): 12.5 ug via INTRAVENOUS

## 2011-05-18 MED ORDER — GLYCOPYRROLATE 0.2 MG/ML IJ SOLN
INTRAMUSCULAR | Status: DC | PRN
  Administered 2011-05-18 (×2): 0.2 mg via INTRAVENOUS

## 2011-05-18 MED ORDER — STERILE WATER FOR IRRIGATION IR SOLN
Status: DC | PRN
  Administered 2011-05-18: 3000 mL

## 2011-05-18 MED ORDER — ACETAMINOPHEN 325 MG PO TABS: 650.0000 mg | ORAL_TABLET | ORAL | Status: DC | PRN

## 2011-05-18 MED ORDER — GLYCINE 1.5 % IR SOLN
Status: DC | PRN
  Administered 2011-05-18: 3000 mL

## 2011-05-18 MED ORDER — PROMETHAZINE HCL 25 MG/ML IJ SOLN: 12.5000 mg | Freq: Four times a day (QID) | INTRAMUSCULAR | Status: DC | PRN

## 2011-05-18 MED ORDER — BELLADONNA ALKALOIDS-OPIUM 16.2-60 MG RE SUPP
RECTAL | Status: DC | PRN
  Administered 2011-05-18: 1 via RECTAL

## 2011-05-18 MED ORDER — TRAMADOL HCL 50 MG PO TABS: 50.0000 mg | ORAL_TABLET | Freq: Four times a day (QID) | ORAL | Status: AC | PRN

## 2011-05-18 MED ORDER — LIDOCAINE HCL (CARDIAC) 20 MG/ML IV SOLN
INTRAVENOUS | Status: DC | PRN
  Administered 2011-05-18: 60 mg via INTRAVENOUS

## 2011-05-18 MED ORDER — SODIUM CHLORIDE 0.9 % IJ SOLN: 3.0000 mL | Freq: Two times a day (BID) | INTRAMUSCULAR | Status: DC

## 2011-05-18 MED ORDER — PROPOFOL 10 MG/ML IV BOLUS
INTRAVENOUS | Status: DC | PRN
  Administered 2011-05-18: 100 mg via INTRAVENOUS

## 2011-05-18 MED ORDER — ONDANSETRON HCL 4 MG/2ML IJ SOLN: 4.0000 mg | Freq: Four times a day (QID) | INTRAMUSCULAR | Status: DC | PRN

## 2011-05-18 MED ORDER — ONDANSETRON HCL 4 MG/2ML IJ SOLN
INTRAMUSCULAR | Status: DC | PRN
  Administered 2011-05-18: 4 mg via INTRAVENOUS

## 2011-05-18 MED ORDER — EPHEDRINE SULFATE 50 MG/ML IJ SOLN
INTRAMUSCULAR | Status: DC | PRN
  Administered 2011-05-18: 15 mg via INTRAVENOUS

## 2011-05-18 MED ORDER — SODIUM CHLORIDE 0.9 % IV SOLN: 250.0000 mL | INTRAVENOUS | Status: DC | PRN

## 2011-05-18 MED ORDER — HYOSCYAMINE SULFATE 0.125 MG SL SUBL: 0.1250 mg | SUBLINGUAL_TABLET | SUBLINGUAL | Status: AC | PRN

## 2011-05-18 MED ORDER — ACETAMINOPHEN 650 MG RE SUPP: 650.0000 mg | RECTAL | Status: DC | PRN

## 2011-05-18 MED ORDER — SODIUM CHLORIDE 0.9 % IJ SOLN: 3.0000 mL | INTRAMUSCULAR | Status: DC | PRN

## 2011-05-18 MED ORDER — PROMETHAZINE HCL 25 MG/ML IJ SOLN: 6.2500 mg | INTRAMUSCULAR | Status: DC | PRN

## 2011-05-18 MED ORDER — FENTANYL CITRATE 0.05 MG/ML IJ SOLN: 25.0000 ug | INTRAMUSCULAR | Status: DC | PRN

## 2011-05-18 MED ORDER — CIPROFLOXACIN HCL 250 MG PO TABS: 250.0000 mg | ORAL_TABLET | Freq: Two times a day (BID) | ORAL | Status: AC

## 2011-05-18 SURGICAL SUPPLY — 27 items
BAG DRAIN URO-CYSTO SKYTR STRL (DRAIN) ×4 IMPLANT
BAG DRN UROCATH (DRAIN) ×3
BAG URINE DRAINAGE (UROLOGICAL SUPPLIES) ×1 IMPLANT
CANISTER SUCT LVC 12 LTR MEDI- (MISCELLANEOUS) ×1 IMPLANT
CATH SILICONE 5CC 18FR (INSTRUMENTS) ×1 IMPLANT
CATH URET 5FR 28IN CONE TIP (BALLOONS)
CATH URET 5FR 28IN OPEN ENDED (CATHETERS) ×4 IMPLANT
CATH URET 5FR 70CM CONE TIP (BALLOONS) IMPLANT
CLOTH BEACON ORANGE TIMEOUT ST (SAFETY) ×4 IMPLANT
DRAPE CAMERA CLOSED 9X96 (DRAPES) ×4 IMPLANT
ELECT REM PT RETURN 9FT ADLT (ELECTROSURGICAL) ×4
ELECTRODE REM PT RTRN 9FT ADLT (ELECTROSURGICAL) IMPLANT
GLOVE INDICATOR 6.5 STRL GRN (GLOVE) ×2 IMPLANT
GLOVE SURG SS PI 8.0 STRL IVOR (GLOVE) ×4 IMPLANT
GLYCINE 1.5% IRRIG UROMATIC (IV SOLUTION) ×1 IMPLANT
GOWN STRL NON-REIN LRG LVL3 (GOWN DISPOSABLE) ×1 IMPLANT
GOWN STRL REIN XL XLG (GOWN DISPOSABLE) ×4 IMPLANT
GOWN XL W/COTTON TOWEL STD (GOWNS) ×4 IMPLANT
GUIDEWIRE 0.038 PTFE COATED (WIRE) IMPLANT
GUIDEWIRE ANG ZIPWIRE 038X150 (WIRE) IMPLANT
GUIDEWIRE STR DUAL SENSOR (WIRE) ×4 IMPLANT
IV NS IRRIG 3000ML ARTHROMATIC (IV SOLUTION) ×7 IMPLANT
NS IRRIG 500ML POUR BTL (IV SOLUTION) IMPLANT
PACK CYSTOSCOPY (CUSTOM PROCEDURE TRAY) ×4 IMPLANT
STENT URET 6FRX24 CONTOUR (STENTS) ×1 IMPLANT
SYRINGE 10CC LL (SYRINGE) ×1 IMPLANT
WATER STERILE IRR 3000ML UROMA (IV SOLUTION) ×1 IMPLANT

## 2011-05-18 NOTE — Progress Notes (Signed)
Per Dr Annabell Howells pt to remove foley cath in 05/19/11/  AM

## 2011-05-18 NOTE — Transfer of Care (Signed)
Immediate Anesthesia Transfer of Care Note  Patient: [name]  Procedure(s) Performed: Procedure(s) (LRB): CYSTOSCOPY/RETROGRADE/URETEROSCOPY (N/A)  Patient Location: Patient transported to PACU with oxygen via face mask at 4 Liters / Min  Anesthesia Type: General  Level of Consciousness: awake and alert   Airway & Oxygen Therapy: Patient Spontanous Breathing and Patient connected to face mask oxygen Post-op Assessment: Report given to PACU RN and Post -op Vital signs reviewed and stable  Post vital signs: Reviewed and stable  Complications: No apparent anesthesia complications

## 2011-05-18 NOTE — Anesthesia Postprocedure Evaluation (Signed)
  Anesthesia Post-op Note  Patient: [name]  Procedure(s) Performed: Procedure(s) (LRB): CYSTOSCOPY/RETROGRADE/URETEROSCOPY (Bilateral) URETEROSCOPY (Left) CYSTOSCOPY WITH BIOPSY (N/A)  Patient Location: PACU  Anesthesia Type: General  Level of Consciousness: oriented and sedated  Airway and Oxygen Therapy: Patient Spontanous Breathing  Post-op Pain: mild  Post-op Assessment: Post-op Vital signs reviewed, Patient's Cardiovascular Status Stable, Respiratory Function Stable and Patent Airway  Post-op Vital Signs: stable  Complications: No apparent anesthesia complications

## 2011-05-18 NOTE — Brief Op Note (Signed)
05/18/2011  8:21 AM  PATIENT:  [name]  78 y.o. female  PRE-OPERATIVE DIAGNOSIS:  History of Left Urteteral Tumor  POST-OPERATIVE DIAGNOSIS:  History of Left Urteteral Tumor and Bladder Tumors  PROCEDURE:  Procedure(s) (LRB): CYSTOSCOPY/BILATERAL RETROGRADE PYELOGRAMS/URETEROSCOPY WITH FULGURATION OF TUMOR/ BLADDER BIOPSY AND FULGURATION OF 2 LESIONS (5 AND 8MM)/FULGURATION OF 5 LESIONS.  SURGEON:  Surgeon(s) and Role:    * Anner Crete, MD - Primary  PHYSICIAN ASSISTANT:   ASSISTANTS: none   ANESTHESIA:   general  EBL:  Total I/O In: 100 [I.V.:100] Out: -   BLOOD ADMINISTERED:none  DRAINS: Urinary Catheter (Foley) and 6Fr x 24cm left JJ stent   LOCAL MEDICATIONS USED:  NONE  SPECIMEN:  Source of Specimen:  bladder biopsy from right lateral wall and left lateral bladder neck.  DISPOSITION OF SPECIMEN:  PATHOLOGY  COUNTS:  YES  TOURNIQUET:  * No tourniquets in log *  DICTATION: .Other Dictation: Dictation Number [id number]  PLAN OF CARE: Discharge to home after PACU  PATIENT DISPOSITION:  PACU - hemodynamically stable.   Delay start of Pharmacological VTE agent (>24hrs) due to surgical blood loss or risk of bleeding: yes

## 2011-05-18 NOTE — Progress Notes (Signed)
Bair Hugger applied due to temp ax 94.9

## 2011-05-18 NOTE — Discharge Instructions (Addendum)
Cystoscopy (Bladder Exam) Care After Refer to this sheet in the next few weeks. These discharge instructions provide you with general information on caring for yourself after you leave the hospital. Your caregiver may also give you specific instructions. Your treatment has been planned according to the most current medical practices available, but unavoidable complications sometimes occur. If you have any problems or questions after discharge, please call your caregiver. AFTER THE PROCEDURE   There may be temporary bleeding and burning with urination.   Drink enough water and fluids to keep your urine clear or pale yellow.  FINDING OUT THE RESULTS OF YOUR TEST Not all test results are available during your visit. If your test results are not back during the visit, make an appointment with your caregiver to find out the results. Do not assume everything is normal if you have not heard from your caregiver or the medical facility. It is important for you to follow up on all of your test results. SEEK IMMEDIATE MEDICAL CARE IF:   There is an increase in blood in the urine or you are passing clots.   There is difficulty passing urine.   You develop the chills.   You have an oral temperature above 102 F (38.9 C), not controlled by medicine.   Belly (abdominal) pain develops.  Document Released: 10/09/2004 Document Revised: 12/02/2010 Document Reviewed: 08/07/2007 [facility] Patient Information 2012 [place], [place].

## 2011-05-18 NOTE — Anesthesia Preprocedure Evaluation (Signed)
Anesthesia Evaluation  Patient identified by MRN, date of birth, ID band Patient awake  General Assessment Comment:Advanced years  Reviewed: Allergy & Precautions, H&P , NPO status , Patient's Chart, lab work & pertinent test results, reviewed documented beta blocker date and time   Airway  TM Distance: >3 FB Neck ROM: Full    Dental  (+) Teeth Intact   Pulmonary neg pulmonary ROS,    + decreased breath sounds      Cardiovascular hypertension, Pt. on medications Regular Normal Denies cardiac symptoms   Neuro/Psych Negative Neurological ROS  Negative Psych ROS   GI/Hepatic negative GI ROS, Neg liver ROS,   Endo/Other  Diabetes mellitus-Hypothyroidism Diet controlled DM Thyroid replacement  Renal/GU negative Renal ROS   Ureteral tumor    Musculoskeletal negative musculoskeletal ROS (+)   Abdominal   Peds negative pediatric ROS (+)  Hematology negative hematology ROS (+)   Anesthesia Other Findings   Reproductive/Obstetrics negative OB ROS                           Anesthesia Physical Anesthesia Plan  ASA: III  Anesthesia Plan: General   Post-op Pain Management:    Induction: Intravenous  Airway Management Planned: LMA  Additional Equipment:   Intra-op Plan:   Post-operative Plan: Extubation in OR  Informed Consent: I have reviewed the patients History and Physical, chart, labs and discussed the procedure including the risks, benefits and alternatives for the proposed anesthesia with the patient or authorized representative who has indicated his/her understanding and acceptance.   Dental advisory given  Plan Discussed with: CRNA and Surgeon  Anesthesia Plan Comments:         Anesthesia Quick Evaluation

## 2011-05-18 NOTE — Op Note (Signed)
NAMETOYIA, JELINEK                  ACCOUNT NO.:  [id number]  MEDICAL RECORD NO.:  [id number]  LOCATION:                               FACILITY:  [facility]  PHYSICIAN:  Excell Seltzer. Annabell Howells, M.D.    DATE OF BIRTH:  04-24-1922  DATE OF PROCEDURE: DATE OF DISCHARGE:                              OPERATIVE REPORT   PROCEDURE: 1. Cystoscopy with bilateral retrograde pyelogram with interpretation. 2. Left ureteroscopy with fulguration of ureteral tumor. 3. Cup bladder biopsy and fulguration of 8 mm right lateral wall     tumor. 4. Cup bladder biopsy and fulguration of 5 mm left bladder neck     lesion. 5. Fulguration of 5 bladder wall tumors on the posterior wall and dome     measuring from 3 mm-8 mm. 6. Insertion of left double-J stent.  PREOPERATIVE DIAGNOSIS:  History of left distal ureteral urothelial carcinoma.  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  SURGEON:  Excell Seltzer. Annabell Howells, M.D.  ANESTHESIA:  General.  SPECIMEN:  Cup biopsies from the right lateral wall tumor and left lateral bladder neck tumor.  DRAINS:  6-French 24 cm left double-J stent and 18-French silicone Foley catheter.  BLOOD LOSS:  Minimal.  COMPLICATIONS:  None.  INDICATIONS:  Ms. Speranza is an 78 year old white female with a history of a low-grade left distal ureteral urothelial carcinoma, initially diagnosed in March 2012.  A repeat ureteroscopy in June 2012 was unremarkable.  She returns now for surveillance cystoscopy and ureterostomy.  FINDINGS AND PROCEDURE:  She was given Cipro.  She was taken to the operating room, where general anesthetic was induced.  She was fitted with PAS hose, latex precautions were in place.  Her perineum and genitalia were prepped with Betadine solution.  She was draped in usual sterile fashion.  Cystoscopy was performed using a 22-French scope and 12 and 70 degree lenses.   Examination revealed a normal urethra.  The ureteral orifices were in the normal anatomic position, effluxing clear urine with no lesions.  Inspection of bladder wall revealed multifocal papillary tumors measuring from approximately 3 mm to 8 mm in size, with tumors noted at the right lateral wall, the right posterior bladder neck, the left lateral bladder neck, the right posterior wall and 3 tumors on the dome for total approximately 7 lesions.  After initial bladder inspection was performed, a left retrograde pyelogram was performed.  This demonstrated slight narrowing of the upper portion of the distal ureter, but no obvious filling defects.  The mid and proximal ureter, and internal collecting system were unremarkable.  Because of the presence of multifocal bladder tumors, I felt that inspection of the right system was indicated.  Right retrograde pyelogram was performed with 5-French open-end catheter and contrast.  The right retrograde pyelogram demonstrated a normal ureter, and internal collecting system.  At this point, left ureteroscopy was performed with a 6.4-French rigid ureteroscope to the level of the iliacs.  Inspection revealed slight shagginess of the left distal ureter and there was a recurrent papillary tumor approximately 3-4 cm above the meatus in the area suggested by  the narrowing on retrograde pyelography.  Inspection of proximal airway revealed normal mucosa.  At this point, the irrigation was changed from saline to glycine and a Bugbee electrode was passed through the ureteroscope and the papillary lesion was fulgurated until it had been completely ablated.  I was not able to fulgurate all of the slightly shaggy mucosa at the distal ureter because of the circumferential and at high risk for creating a stricture.  At this point, the cystoscope was reinserted with a cup biopsy forceps and biopsies were obtained from the left bladder neck, a tumor, and  the right lateral wall tumor.  These specimens were sent separately.  A Bugbee electrode was then passed through the cystoscope and the biopsy sites were fulgurated to achieve hemostasis.  At this point, the remaining lesions at the right bladder neck, the posterior wall, and dome were fulgurated with lesions measuring between 3 and 8 mm in size.  Once all visible lesions had been fulgurated and confirmed by the reinspection of bladder wall, a guidewire was placed to the right kidney under fluoroscopic guidance, and a 6-French 24 cm double-J stent without string was passed to the kidney.  At this point, the bladder was drained and an 18-French silicone Foley was placed.  The balloon was filled with 5 mL sterile fluid and the catheter was placed to straight drainage.  A B and O suppository was placed.  The patient was taken down from lithotomy position.  Her anesthetic was reversed.  She was moved to recovery room in stable condition.  I elected not to give mitomycin-C since she will need BCG therapy, and I was concerned about reflux of mitomycin-C with the stent.  There were no complications during the procedure.     Excell Seltzer. Annabell Howells, M.D.     JJW/MEDQ  D:  05/18/2011  T:  05/18/2011  Job:  [id number]  cc:   Kingsley Callander. Ouida Sills, MD Fax: [phone]

## 2011-05-18 NOTE — Anesthesia Procedure Notes (Signed)
Procedure Name: LMA Insertion Date/Time: 05/18/2011 7:25 AM Performed by: Lorrin Jackson Pre-anesthesia Checklist: Patient identified, Emergency Drugs available, Suction available and Patient being monitored Patient Re-evaluated:Patient Re-evaluated prior to inductionOxygen Delivery Method: Circle System Utilized Preoxygenation: Pre-oxygenation with 100% oxygen Intubation Type: IV induction Ventilation: Mask ventilation without difficulty LMA: LMA inserted LMA Size: 4.0 Number of attempts: 1 Airway Equipment and Method: bite block Placement Confirmation: positive ETCO2 Tube secured with: Tape Dental Injury: Teeth and Oropharynx as per pre-operative assessment  Comments: Inserted by Dr. Shireen Quan

## 2011-05-18 NOTE — Interval H&P Note (Signed)
History and Physical Interval Note:  05/18/2011 7:16 AM  [name]  has presented today for surgery, with the diagnosis of History of Left Urteteral Tumor  The various methods of treatment have been discussed with the patient and family. After consideration of risks, benefits and other options for treatment, the patient has consented to  Procedure(s) (LRB): CYSTOSCOPY/RETROGRADE/URETEROSCOPY (N/A) as a surgical intervention .  The patients' history has been reviewed, patient examined, no change in status, stable for surgery.  I have reviewed the patients' chart and labs.  Questions were answered to the patient's satisfaction.     [name]

## 2011-05-19 ENCOUNTER — Encounter (HOSPITAL_BASED_OUTPATIENT_CLINIC_OR_DEPARTMENT_OTHER): Payer: Self-pay | Admitting: Urology

## 2011-06-04 ENCOUNTER — Ambulatory Visit (INDEPENDENT_AMBULATORY_CARE_PROVIDER_SITE_OTHER): Payer: Medicare Other | Admitting: Urology

## 2011-06-04 DIAGNOSIS — Z8551 Personal history of malignant neoplasm of bladder: Secondary | ICD-10-CM

## 2011-06-04 DIAGNOSIS — C669 Malignant neoplasm of unspecified ureter: Secondary | ICD-10-CM

## 2011-06-04 DIAGNOSIS — R82998 Other abnormal findings in urine: Secondary | ICD-10-CM

## 2011-06-18 ENCOUNTER — Ambulatory Visit (INDEPENDENT_AMBULATORY_CARE_PROVIDER_SITE_OTHER): Payer: Medicare Other | Admitting: Urology

## 2011-06-18 DIAGNOSIS — Z8551 Personal history of malignant neoplasm of bladder: Secondary | ICD-10-CM

## 2011-06-25 ENCOUNTER — Ambulatory Visit (INDEPENDENT_AMBULATORY_CARE_PROVIDER_SITE_OTHER): Payer: Medicare Other | Admitting: Urology

## 2011-06-25 DIAGNOSIS — Z8551 Personal history of malignant neoplasm of bladder: Secondary | ICD-10-CM

## 2011-07-12 ENCOUNTER — Other Ambulatory Visit (HOSPITAL_COMMUNITY): Payer: Self-pay | Admitting: Internal Medicine

## 2011-07-12 DIAGNOSIS — Z139 Encounter for screening, unspecified: Secondary | ICD-10-CM

## 2011-08-13 ENCOUNTER — Other Ambulatory Visit: Payer: Self-pay | Admitting: Urology

## 2011-08-24 ENCOUNTER — Ambulatory Visit (HOSPITAL_COMMUNITY)
Admission: RE | Admit: 2011-08-24 | Discharge: 2011-08-24 | Disposition: A | Discharge status: Home / Self Care | Payer: Medicare Other | Source: Ambulatory Visit | Attending: Internal Medicine | Admitting: Internal Medicine

## 2011-08-24 DIAGNOSIS — Z139 Encounter for screening, unspecified: Secondary | ICD-10-CM

## 2011-08-24 DIAGNOSIS — Z1231 Encounter for screening mammogram for malignant neoplasm of breast: Secondary | ICD-10-CM | POA: Insufficient documentation

## 2011-10-01 ENCOUNTER — Ambulatory Visit (INDEPENDENT_AMBULATORY_CARE_PROVIDER_SITE_OTHER): Payer: Medicare Other | Admitting: Urology

## 2011-10-01 DIAGNOSIS — R3129 Other microscopic hematuria: Secondary | ICD-10-CM

## 2011-10-01 DIAGNOSIS — R82998 Other abnormal findings in urine: Secondary | ICD-10-CM

## 2011-10-01 DIAGNOSIS — Z8551 Personal history of malignant neoplasm of bladder: Secondary | ICD-10-CM

## 2011-10-01 DIAGNOSIS — C669 Malignant neoplasm of unspecified ureter: Secondary | ICD-10-CM

## 2011-10-04 ENCOUNTER — Encounter (HOSPITAL_BASED_OUTPATIENT_CLINIC_OR_DEPARTMENT_OTHER): Payer: Self-pay | Admitting: *Deleted

## 2011-10-04 NOTE — Progress Notes (Signed)
NPO AFTER MN. ARRIVES AT 0715. NEEDS ISTAT. CURRENT EKG IN EPIC AND CHART. WILL TAKE NORVASC, METOPROLOL, AND SYNTHROID AM OF SURG. W/ SIP OF WATER.

## 2011-10-13 NOTE — H&P (Signed)
e Problems 1. 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 Malignant Neoplasm Of The Ureter Left 189.2 3. Microscopic Hematuria 599.72 4. Pyuria 791.9  History of Present Illness  [name] returns today in f/u.  She has a history of a low grade non-invasive left ureteral tumor that was treated locally and then she developed bladder tumors which were resected along with recurrent ureteral tumors.  She was stented and had BCG that she completed in April and her stent was removed on 5/9.  She is to return to the OR for cystoscopy and ureteroscopy.  She has been doing well and denies any gross hematuria or significant left flank pain but she does have a sensation of fullness on that side.   Past Medical History 1. History of  Diabetes Mellitus 250.00 2. History of  Gross Hematuria 599.71 3. History of  Hypercholesterolemia 272.0 4. History of  Hypertension 401.9 5. History of  Hypothyroidism 244.9 6. 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 Malignant Neoplasm Of The Ureter Left 189.2 8. History of  Pyuria 791.9 9. 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 History of  Cystoscopy With Fulguration Small Lesion (5-56mm) 2. History of  Cystoscopy With Insertion Of Ureteral Stent Left 3. History of  Cystoscopy With Insertion Of Ureteral Stent Left 4. History of  Cystoscopy With Insertion Of Ureteral Stent Right 5. History of  Cystoscopy With Resection Of Tumor 6. History of  Cystoscopy With Resection Of Tumor 7. History of  Cystoscopy With Ureteroscopy For Biopsy Left 8. History of  Cystoscopy With Ureteroscopy Left 9. History of  No Surgical Problems  Current Meds 1. AmLODIPine Besylate 10 MG Oral Tablet; Therapy: (Recorded:27Jan2012) to 2. Aspirin Low Dose 81 MG Oral Tablet; Therapy: (Recorded:27Jan2012) to 3. Caltrate 600+D TABS; Therapy: (Recorded:27Jan2012) to 4. Clobetasol Propionate 0.05 % External Cream; Therapy:  19Nov2012 to 5. Hydrochlorothiazide 25 MG Oral Tablet; Therapy: (Recorded:27Jan2012) to 6. Klor-Con 20 MEQ Oral Packet; Therapy: (Recorded:27Jan2012) to 7. Levothyroxine Sodium 50 MCG Oral Tablet; Therapy: (Recorded:27Jan2012) to 8. Metoprolol Tartrate 50 MG Oral Tablet; Therapy: 25May2012 to 9. Potassium Chloride Crys ER 20 MEQ Oral Tablet Extended Release; Therapy: 01May2012 to 10. Simvastatin 20 MG Oral Tablet; Therapy: (Recorded:27Jan2012) to  Allergies 1. No Known Drug Allergies  Family History 1. Family history of  Family Health Status Number Of Children  Social History 1. Caffeine Use 1 coffee per day 2. Marital History - Currently Married 3. Never A Smoker 4. Retired From Work Denied  5. History of  Alcohol Use 6. History of  Tobacco Use   Past and social history reviewed and updated.   Review of Systems  Genitourinary: no dysuria.  Gastrointestinal: no diarrhea and no constipation.  Constitutional: no recent weight loss.  Cardiovascular: no chest pain and no leg swelling.  Respiratory: no shortness of breath.    Vitals Vital Signs [Data Includes: Last 1 Day]  28Jun2013 08:57AM  Blood Pressure: 136 / 54 Temperature: 97.7 F Heart Rate: 66  Physical Exam Constitutional: Well nourished and well developed . No acute distress.  Pulmonary: No respiratory distress and normal respiratory rhythm and effort.  Cardiovascular: Heart rate and rhythm are normal . No peripheral edema.  Abdomen: The abdomen is soft and nontender. No masses are palpated.    Results/Data Urine [Data Includes: Last 1 Day]   28Jun2013  COLOR YELLOW   APPEARANCE CLEAR   SPECIFIC GRAVITY 1.020   pH 5.5  GLUCOSE NEG mg/dL  BILIRUBIN NEG   KETONE NEG mg/dL  BLOOD LARGE   PROTEIN NEG mg/dL  UROBILINOGEN 0.2 mg/dL  NITRITE NEG   LEUKOCYTE ESTERASE SMALL   SQUAMOUS EPITHELIAL/HPF RARE   WBC 3-6 WBC/hpf  RBC 11-20 RBC/hpf  BACTERIA NONE SEEN   CRYSTALS NONE SEEN   CASTS NONE SEEN     Other MUCUS    Assessment 1. 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 Microscopic Hematuria 599.72 3. Pyuria 791.9 4. Malignant Neoplasm Of The Ureter Left 189.2   She is doing well with only mild fullness on the left and mild microhematuria and pyuria without voiding symptoms.   Plan Malignant Neoplasm Of The Ureter (189.2)  1. UA With REFLEX  Done: 28Jun2013 08:59AM Pyuria (791.9)  2. URINE CULTURE  Requested for: 28Jun2013      She is on the schedule on 7/11 for cystoscopy and left RTG with ureteroscopy.  I reviewed the risks in detail including bleeding, infection, injury to the bladder or ureter, need for a stent, thrombotic events and anesthetic complications.  urine culture today.

## 2011-10-14 ENCOUNTER — Encounter (HOSPITAL_BASED_OUTPATIENT_CLINIC_OR_DEPARTMENT_OTHER): Payer: Self-pay | Admitting: Anesthesiology

## 2011-10-14 ENCOUNTER — Encounter (HOSPITAL_BASED_OUTPATIENT_CLINIC_OR_DEPARTMENT_OTHER)
Admission: RE | Disposition: A | Discharge status: Home / Self Care | Payer: Self-pay | Source: Ambulatory Visit | Attending: Urology

## 2011-10-14 ENCOUNTER — Encounter (HOSPITAL_BASED_OUTPATIENT_CLINIC_OR_DEPARTMENT_OTHER): Payer: Self-pay | Admitting: *Deleted

## 2011-10-14 ENCOUNTER — Ambulatory Visit (HOSPITAL_BASED_OUTPATIENT_CLINIC_OR_DEPARTMENT_OTHER)
Admission: RE | Admit: 2011-10-14 | Discharge: 2011-10-14 | Disposition: A | Discharge status: Home / Self Care | Payer: Medicare Other | Source: Ambulatory Visit | Attending: Urology | Admitting: Urology

## 2011-10-14 ENCOUNTER — Ambulatory Visit (HOSPITAL_BASED_OUTPATIENT_CLINIC_OR_DEPARTMENT_OTHER): Payer: Medicare Other | Admitting: Anesthesiology

## 2011-10-14 DIAGNOSIS — R82998 Other abnormal findings in urine: Secondary | ICD-10-CM | POA: Insufficient documentation

## 2011-10-14 DIAGNOSIS — I10 Essential (primary) hypertension: Secondary | ICD-10-CM | POA: Insufficient documentation

## 2011-10-14 DIAGNOSIS — R3129 Other microscopic hematuria: Secondary | ICD-10-CM | POA: Insufficient documentation

## 2011-10-14 DIAGNOSIS — Z79899 Other long term (current) drug therapy: Secondary | ICD-10-CM | POA: Insufficient documentation

## 2011-10-14 DIAGNOSIS — E119 Type 2 diabetes mellitus without complications: Secondary | ICD-10-CM | POA: Insufficient documentation

## 2011-10-14 DIAGNOSIS — Z7982 Long term (current) use of aspirin: Secondary | ICD-10-CM | POA: Insufficient documentation

## 2011-10-14 DIAGNOSIS — E039 Hypothyroidism, unspecified: Secondary | ICD-10-CM | POA: Insufficient documentation

## 2011-10-14 DIAGNOSIS — Z8551 Personal history of malignant neoplasm of bladder: Secondary | ICD-10-CM | POA: Insufficient documentation

## 2011-10-14 DIAGNOSIS — E7800 Pure hypercholesterolemia, unspecified: Secondary | ICD-10-CM | POA: Insufficient documentation

## 2011-10-14 DIAGNOSIS — Z8559 Personal history of malignant neoplasm of other urinary tract organ: Secondary | ICD-10-CM | POA: Insufficient documentation

## 2011-10-14 HISTORY — DX: Type 2 diabetes mellitus without complications: E11.9

## 2011-10-14 HISTORY — DX: Nocturia: R35.1

## 2011-10-14 HISTORY — PX: CYSTOSCOPY/RETROGRADE/URETEROSCOPY: SHX5316

## 2011-10-14 HISTORY — DX: Personal history of malignant neoplasm of ureter: Z85.54

## 2011-10-14 HISTORY — DX: Other specified postprocedural states: Z98.890

## 2011-10-14 LAB — POCT I-STAT 4, (NA,K, GLUC, HGB,HCT)
Glucose, Bld: 96 mg/dL (ref 70–99)
HCT: 39 % (ref 36.0–46.0)
Hemoglobin: 13.3 g/dL (ref 12.0–15.0)
Sodium: 142 mEq/L (ref 135–145)

## 2011-10-14 SURGERY — CYSTOSCOPY/RETROGRADE/URETEROSCOPY
Anesthesia: General | Site: Ureter | Laterality: Left | Wound class: Clean Contaminated

## 2011-10-14 MED ORDER — ACETAMINOPHEN 650 MG RE SUPP: 650.0000 mg | RECTAL | Status: DC | PRN

## 2011-10-14 MED ORDER — ONDANSETRON HCL 4 MG/2ML IJ SOLN
INTRAMUSCULAR | Status: DC | PRN
  Administered 2011-10-14: 4 mg via INTRAVENOUS

## 2011-10-14 MED ORDER — ACETAMINOPHEN 325 MG PO TABS: 650.0000 mg | ORAL_TABLET | ORAL | Status: DC | PRN

## 2011-10-14 MED ORDER — SODIUM CHLORIDE 0.9 % IJ SOLN: 3.0000 mL | INTRAMUSCULAR | Status: DC | PRN

## 2011-10-14 MED ORDER — CIPROFLOXACIN IN D5W 400 MG/200ML IV SOLN
400.0000 mg | INTRAVENOUS | Status: AC
  Administered 2011-10-14: 400 mg via INTRAVENOUS

## 2011-10-14 MED ORDER — ONDANSETRON HCL 4 MG/2ML IJ SOLN: 4.0000 mg | Freq: Four times a day (QID) | INTRAMUSCULAR | Status: DC | PRN

## 2011-10-14 MED ORDER — OXYCODONE HCL 5 MG PO TABS: 5.0000 mg | ORAL_TABLET | ORAL | Status: DC | PRN

## 2011-10-14 MED ORDER — SODIUM CHLORIDE 0.9 % IV SOLN: 250.0000 mL | INTRAVENOUS | Status: DC | PRN

## 2011-10-14 MED ORDER — FENTANYL CITRATE 0.05 MG/ML IJ SOLN: 25.0000 ug | INTRAMUSCULAR | Status: DC | PRN

## 2011-10-14 MED ORDER — LIDOCAINE HCL (CARDIAC) 20 MG/ML IV SOLN
INTRAVENOUS | Status: DC | PRN
  Administered 2011-10-14: 40 mg via INTRAVENOUS

## 2011-10-14 MED ORDER — FENTANYL CITRATE 0.05 MG/ML IJ SOLN
INTRAMUSCULAR | Status: DC | PRN
  Administered 2011-10-14: 25 ug via INTRAVENOUS

## 2011-10-14 MED ORDER — PROPOFOL 10 MG/ML IV EMUL
INTRAVENOUS | Status: DC | PRN
  Administered 2011-10-14: 100 mg via INTRAVENOUS

## 2011-10-14 MED ORDER — LACTATED RINGERS IV SOLN
INTRAVENOUS | Status: DC
  Administered 2011-10-14: 08:00:00 via INTRAVENOUS

## 2011-10-14 MED ORDER — SODIUM CHLORIDE 0.9 % IR SOLN
Status: DC | PRN
  Administered 2011-10-14: 6000 mL

## 2011-10-14 MED ORDER — SODIUM CHLORIDE 0.9 % IJ SOLN: 3.0000 mL | Freq: Two times a day (BID) | INTRAMUSCULAR | Status: DC

## 2011-10-14 SURGICAL SUPPLY — 22 items
BAG DRAIN URO-CYSTO SKYTR STRL (DRAIN) ×3 IMPLANT
BAG DRN UROCATH (DRAIN) ×2
CANISTER SUCT LVC 12 LTR MEDI- (MISCELLANEOUS) ×2 IMPLANT
CATH URET 5FR 28IN CONE TIP (BALLOONS)
CATH URET 5FR 28IN OPEN ENDED (CATHETERS) ×3 IMPLANT
CATH URET 5FR 70CM CONE TIP (BALLOONS) IMPLANT
CLOTH BEACON ORANGE TIMEOUT ST (SAFETY) ×3 IMPLANT
DRAPE CAMERA CLOSED 9X96 (DRAPES) ×3 IMPLANT
GLOVE INDICATOR 7.5 STRL GRN (GLOVE) ×4 IMPLANT
GLOVE SKINSENSE NS SZ6.5 (GLOVE) ×2
GLOVE SKINSENSE NS SZ8.0 LF (GLOVE) ×1
GLOVE SKINSENSE STRL SZ6.5 (GLOVE) ×2 IMPLANT
GLOVE SKINSENSE STRL SZ8.0 LF (GLOVE) ×1 IMPLANT
GLOVE SURG SS PI 8.0 STRL IVOR (GLOVE) ×3 IMPLANT
GOWN STRL REIN XL XLG (GOWN DISPOSABLE) ×3 IMPLANT
GOWN XL W/COTTON TOWEL STD (GOWNS) ×3 IMPLANT
GUIDEWIRE 0.038 PTFE COATED (WIRE) IMPLANT
GUIDEWIRE ANG ZIPWIRE 038X150 (WIRE) IMPLANT
GUIDEWIRE STR DUAL SENSOR (WIRE) ×5 IMPLANT
IV NS IRRIG 3000ML ARTHROMATIC (IV SOLUTION) ×6 IMPLANT
NS IRRIG 500ML POUR BTL (IV SOLUTION) IMPLANT
PACK CYSTOSCOPY (CUSTOM PROCEDURE TRAY) ×3 IMPLANT

## 2011-10-14 NOTE — Anesthesia Procedure Notes (Signed)
Procedure Name: LMA Insertion Date/Time: 10/14/2011 8:27 AM Performed by: Maris Berger T Pre-anesthesia Checklist: Patient identified, Emergency Drugs available, Suction available and Patient being monitored Patient Re-evaluated:Patient Re-evaluated prior to inductionOxygen Delivery Method: Circle System Utilized Preoxygenation: Pre-oxygenation with 100% oxygen Intubation Type: IV induction Ventilation: Mask ventilation without difficulty LMA: LMA inserted LMA Size: 4.0 Number of attempts: 1 Airway Equipment and Method: bite block Placement Confirmation: positive ETCO2 Dental Injury: Teeth and Oropharynx as per pre-operative assessment

## 2011-10-14 NOTE — Anesthesia Preprocedure Evaluation (Addendum)
Anesthesia Evaluation  Patient identified by MRN, date of birth, ID band Patient awake    Reviewed: Allergy & Precautions, H&P , NPO status , Patient's Chart, lab work & pertinent test results, reviewed documented beta blocker date and time   Airway Mallampati: II TM Distance: >3 FB Neck ROM: full    Dental No notable dental hx.    Pulmonary COPD breath sounds clear to auscultation  Pulmonary exam normal       Cardiovascular Exercise Tolerance: Good hypertension, On Home Beta Blockers and On Medications Rhythm:regular Rate:Normal     Neuro/Psych negative neurological ROS  negative psych ROS   GI/Hepatic negative GI ROS, Neg liver ROS,   Endo/Other  Type 2  Renal/GU negative Renal ROS  negative genitourinary   Musculoskeletal   Abdominal   Peds  Hematology negative hematology ROS (+)   Anesthesia Other Findings   Reproductive/Obstetrics negative OB ROS                           Anesthesia Physical Anesthesia Plan  ASA: III  Anesthesia Plan: General LMA   Post-op Pain Management:    Induction:   Airway Management Planned:   Additional Equipment:   Intra-op Plan:   Post-operative Plan:   Informed Consent: I have reviewed the patients History and Physical, chart, labs and discussed the procedure including the risks, benefits and alternatives for the proposed anesthesia with the patient or authorized representative who has indicated his/her understanding and acceptance.   Dental Advisory Given  Plan Discussed with: CRNA  Anesthesia Plan Comments: (February 2013 had GA with 4 LMA)       Anesthesia Quick Evaluation

## 2011-10-14 NOTE — Transfer of Care (Signed)
Immediate Anesthesia Transfer of Care Note  Patient: [name]  Procedure(s) Performed: Procedure(s) (LRB): CYSTOSCOPY/RETROGRADE/URETEROSCOPY (Left)  Patient Location: PACU  Anesthesia Type: General  Level of Consciousness: sedated  Airway & Oxygen Therapy: Patient Spontanous Breathing and Patient connected to nasal cannula oxygen  Post-op Assessment: Report given to PACU RN  Post vital signs: Reviewed and stable  Complications: No apparent anesthesia complications

## 2011-10-14 NOTE — Brief Op Note (Signed)
10/14/2011  8:46 AM  PATIENT:  [name]  78 y.o. female  PRE-OPERATIVE DIAGNOSIS:  History of Bladder and Left Ureteral Tumors  POST-OPERATIVE DIAGNOSIS:  History of Bladder and Left Ureteral Tumors  PROCEDURE:  Procedure(s) (LRB): CYSTOSCOPY and URETEROSCOPY (Left)  SURGEON:  Surgeon(s) and Role:    * Anner Crete, MD - Primary  PHYSICIAN ASSISTANT:   ASSISTANTS: none   ANESTHESIA:   general  EBL:     BLOOD ADMINISTERED:none  DRAINS: none   LOCAL MEDICATIONS USED:  NONE  SPECIMEN:  No Specimen  DISPOSITION OF SPECIMEN:  N/A  COUNTS:  YES  TOURNIQUET:  * No tourniquets in log *  DICTATION: .Other Dictation: Dictation Number [id number]  PLAN OF CARE: Discharge to home after PACU  PATIENT DISPOSITION:  PACU - hemodynamically stable.   Delay start of Pharmacological VTE agent (>24hrs) due to surgical blood loss or risk of bleeding: no

## 2011-10-14 NOTE — Anesthesia Postprocedure Evaluation (Signed)
  Anesthesia Post-op Note  Patient: [name]  Procedure(s) Performed: Procedure(s) (LRB): CYSTOSCOPY/RETROGRADE/URETEROSCOPY (Left)  Patient Location: PACU  Anesthesia Type: General  Level of Consciousness: awake and alert   Airway and Oxygen Therapy: Patient Spontanous Breathing  Post-op Pain: mild  Post-op Assessment: Post-op Vital signs reviewed, Patient's Cardiovascular Status Stable, Respiratory Function Stable, Patent Airway and No signs of Nausea or vomiting  Post-op Vital Signs: stable  Complications: No apparent anesthesia complications

## 2011-10-14 NOTE — Interval H&P Note (Signed)
History and Physical Interval Note:  10/14/2011 8:14 AM  [name]  has presented today for surgery, with the diagnosis of History of Bladder and Left Ureteral Tumors  The various methods of treatment have been discussed with the patient and family. After consideration of risks, benefits and other options for treatment, the patient has consented to  Procedure(s) (LRB): CYSTOSCOPY/RETROGRADE/URETEROSCOPY (Left) HOLMIUM LASER APPLICATION (Left) as a surgical intervention .  [name] history has been reviewed, patient examined, no change in status, stable for surgery.  I have reviewed the patients' chart and labs.  Questions were answered to the patient's satisfaction.     Kersti Scavone J

## 2011-10-15 NOTE — Op Note (Signed)
NAMEARRINGTON, BENCOMO                  ACCOUNT NO.:  [id number]  MEDICAL RECORD NO.:  [id number]  LOCATION:                               FACILITY:  [facility]  PHYSICIAN:  Excell Seltzer. Annabell Howells, M.D.    DATE OF BIRTH:  [birth year]  DATE OF PROCEDURE:  10/14/2011 DATE OF DISCHARGE:                              OPERATIVE REPORT   PROCEDURE:  Cystoscopy and left ureteroscopy.  PREOPERATIVE DIAGNOSIS:  History of left ureteral and bladder tumors.  POSTOPERATIVE DIAGNOSIS:  History of left ureteral and bladder tumors without recurrence.  SURGEON:  Excell Seltzer. Annabell Howells, MD  ANESTHESIA:  General.  SPECIMEN:  None.  DRAINS:  None.  COMPLICATIONS:  None.  INDICATIONS:  Ms. Karow is an 78 year old white female, who has a history of left distal ureteral low-grade urothelial carcinoma, then underwent resection.  She on subsequent followup developed bladder implants and recurrence in the ureter.  These were treated with fulguration and resection and then she underwent BCG with the left stent indwelling.  Her stent was removed.  After completion of BCG, she returns now for 3 month followup for cystoscopy and ureteroscopy.  FINDINGS OF PROCEDURE:  She was taken to the operating room where a general anesthetic was induced.  She was given Cipro.  She was placed in lithotomy position and fitted with PAS hose.  Her perineum and genitalia were prepped with Betadine solution.  She was draped in usual sterile fashion.  Latex precautions were observed.  Cystoscopy was performed using a 22- [place] scope with 12 and 70 degree lenses.  Examination revealed a normal urethra, bladder wall and mild trabeculation.  No tumors, stones, or inflammation were noted.  Ureteral orifices were unremarkable.  The left ureteral orifice was then cannulated with a 6.5-French rigid ureteroscope.  This was easily passed to the renal pelvis.  No recurrent papillary tumors were noted.  There were some small nodules that appeared most  consistent with lymphoid follicles, possibly related to the prior BCG but nothing that appeared to merit biopsy.  Once thorough inspection was performed, the bladder was drained.  The patient was taken down from lithotomy position.  Her anesthetic was reversed.  She was moved to recovery room in stable condition.  There were no complications.     Excell Seltzer. Annabell Howells, M.D.     JJW/MEDQ  D:  10/14/2011  T:  10/15/2011  Job:  [id number]

## 2011-10-19 ENCOUNTER — Encounter (HOSPITAL_BASED_OUTPATIENT_CLINIC_OR_DEPARTMENT_OTHER): Payer: Self-pay | Admitting: Urology

## 2011-10-20 ENCOUNTER — Encounter (HOSPITAL_BASED_OUTPATIENT_CLINIC_OR_DEPARTMENT_OTHER): Payer: Self-pay

## 2011-10-29 ENCOUNTER — Ambulatory Visit (INDEPENDENT_AMBULATORY_CARE_PROVIDER_SITE_OTHER): Payer: Medicare Other | Admitting: Urology

## 2011-10-29 DIAGNOSIS — Z8551 Personal history of malignant neoplasm of bladder: Secondary | ICD-10-CM

## 2011-10-29 DIAGNOSIS — C669 Malignant neoplasm of unspecified ureter: Secondary | ICD-10-CM

## 2011-11-05 ENCOUNTER — Ambulatory Visit (INDEPENDENT_AMBULATORY_CARE_PROVIDER_SITE_OTHER): Payer: Medicare Other | Admitting: Urology

## 2011-11-05 DIAGNOSIS — C670 Malignant neoplasm of trigone of bladder: Secondary | ICD-10-CM

## 2011-12-31 ENCOUNTER — Ambulatory Visit (INDEPENDENT_AMBULATORY_CARE_PROVIDER_SITE_OTHER): Payer: Medicare Other | Admitting: Urology

## 2011-12-31 DIAGNOSIS — Z8559 Personal history of malignant neoplasm of other urinary tract organ: Secondary | ICD-10-CM

## 2011-12-31 DIAGNOSIS — Z8551 Personal history of malignant neoplasm of bladder: Secondary | ICD-10-CM

## 2012-01-04 ENCOUNTER — Other Ambulatory Visit: Payer: Self-pay | Admitting: Urology

## 2012-01-13 ENCOUNTER — Encounter (HOSPITAL_BASED_OUTPATIENT_CLINIC_OR_DEPARTMENT_OTHER): Payer: Self-pay | Admitting: *Deleted

## 2012-01-17 NOTE — H&P (Signed)
ctive Problems 1. 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 Pyuria 791.9  History of Present Illness  [name] returns today in f/u.  She has a history of a low grade non-invasive left ureteral tumor that was treated locally and then she developed bladder tumors which were resected along with recurrent ureteral tumors.  She was stented and had BCG.   Her last surveillance with cystoscopy and ureteroscopy in July 2013 was negative.  She got maintenance BCG with a stent in place then.  She has been doing well and denies any gross hematuria or significant left flank pain..   Past Medical History 1. History of  Diabetes Mellitus 250.00 2. History of  Gross Hematuria 599.71 3. History of  Hypercholesterolemia 272.0 4. History of  Hypertension 401.9 5. History of  Hypothyroidism 244.9 6. 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 History of  Microscopic Hematuria 599.72 9. History of  Pyuria 791.9 10. 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 History of  Cystoscopy With Fulguration Small Lesion (5-33mm) 2. History of  Cystoscopy With Insertion Of Ureteral Stent Left 3. History of  Cystoscopy With Insertion Of Ureteral Stent Left 4. History of  Cystoscopy With Insertion Of Ureteral Stent Right 5. History of  Cystoscopy With Resection Of Tumor 6. History of  Cystoscopy With Resection Of Tumor 7. History of  Cystoscopy With Ureteroscopy For Biopsy Left 8. History of  Cystoscopy With Ureteroscopy Left 9. History of  Cystoscopy With Ureteroscopy Left 10. History of  No Surgical Problems  Current Meds 1. AmLODIPine Besylate 10 MG Oral Tablet; Therapy: (Recorded:27Jan2012) to 2. Aspirin Low Dose 81 MG Oral Tablet; Therapy: (Recorded:27Jan2012) to 3. Caltrate 600+D TABS; Therapy: (Recorded:27Jan2012) to 4.  Clobetasol Propionate 0.05 % External Cream; Therapy: 19Nov2012 to 5. Hydrochlorothiazide 25 MG Oral Tablet; Therapy: (Recorded:27Jan2012) to 6. Klor-Con 20 MEQ Oral Packet; Therapy: (Recorded:27Jan2012) to 7. Levothyroxine Sodium 50 MCG Oral Tablet; Therapy: (Recorded:27Jan2012) to 8. Metoprolol Tartrate 50 MG Oral Tablet; Therapy: 25May2012 to 9. Potassium Chloride Crys ER 20 MEQ Oral Tablet Extended Release; Therapy: 01May2012 to 10. Simvastatin 20 MG Oral Tablet; Therapy: (Recorded:27Jan2012) to  Allergies 1. No Known Drug Allergies  Family History 1. Family history of  Family Health Status Number Of Children  Social History 1. Caffeine Use 1 coffee per day 2. Marital History - Currently Married 3. Never A Smoker 4. Retired From Work Denied  5. History of  Alcohol Use   Past and social history reviewed and updated.   Review of Systems Genitourinary, constitutional, skin, eye, otolaryngeal, hematologic/lymphatic, cardiovascular, pulmonary, endocrine, musculoskeletal, gastrointestinal, neurological and psychiatric system(s) were reviewed and pertinent findings if present are noted.  Constitutional: no fever.  Cardiovascular: no chest pain.  Respiratory: no shortness of breath.    Vitals Vital Signs [Data Includes: Last 1 Day]  27Sep2013 09:55AM  Blood Pressure: 148 / 68 Temperature: 97.4 F Heart Rate: 57  Physical Exam Constitutional: Well nourished and well developed . No acute distress.  Pulmonary: No respiratory distress and normal respiratory rhythm and effort.  Cardiovascular: Heart rate and rhythm are normal . No peripheral edema.  Abdomen: The abdomen is soft and nontender. No masses are palpated. No CVA tenderness. No hernias are palpable. No hepatosplenomegaly noted.    Results/Data Urine [Data Includes: Last 1 Day]  27Sep2013  COLOR YELLOW   APPEARANCE CLEAR   SPECIFIC GRAVITY 1.010   pH 7.0   GLUCOSE NEG mg/dL  BILIRUBIN NEG   KETONE NEG mg/dL    BLOOD NEG   PROTEIN NEG mg/dL  UROBILINOGEN 1 mg/dL  NITRITE NEG   LEUKOCYTE ESTERASE TRACE   SQUAMOUS EPITHELIAL/HPF FEW   WBC 3-6 WBC/hpf  RBC 0-2 RBC/hpf  BACTERIA NONE SEEN   CRYSTALS NONE SEEN   CASTS NONE SEEN   Other MUCUS    Assessment 1. 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   Plan Malignant Neoplasm Of The Ureter (189.2)  1. UA With REFLEX  Done: 27Sep2013 10:00AM   I will set her up for cystoscopy with retrogrades and possible left ureteroscopy and stenting.   The risks were reviewed. We will do this toward the end of October.   Discussion/Summary  CC: Dr. Carylon Perches.

## 2012-01-18 ENCOUNTER — Ambulatory Visit (HOSPITAL_BASED_OUTPATIENT_CLINIC_OR_DEPARTMENT_OTHER): Payer: Medicare Other | Admitting: Anesthesiology

## 2012-01-18 ENCOUNTER — Encounter (HOSPITAL_BASED_OUTPATIENT_CLINIC_OR_DEPARTMENT_OTHER): Payer: Self-pay | Admitting: Anesthesiology

## 2012-01-18 ENCOUNTER — Ambulatory Visit (HOSPITAL_BASED_OUTPATIENT_CLINIC_OR_DEPARTMENT_OTHER)
Admission: RE | Admit: 2012-01-18 | Discharge: 2012-01-18 | Disposition: A | Discharge status: Home / Self Care | Payer: Medicare Other | Source: Ambulatory Visit | Attending: Urology | Admitting: Urology

## 2012-01-18 ENCOUNTER — Encounter (HOSPITAL_BASED_OUTPATIENT_CLINIC_OR_DEPARTMENT_OTHER): Payer: Self-pay | Admitting: *Deleted

## 2012-01-18 ENCOUNTER — Encounter (HOSPITAL_BASED_OUTPATIENT_CLINIC_OR_DEPARTMENT_OTHER)
Admission: RE | Disposition: A | Discharge status: Home / Self Care | Payer: Self-pay | Source: Ambulatory Visit | Attending: Urology

## 2012-01-18 DIAGNOSIS — J4489 Other specified chronic obstructive pulmonary disease: Secondary | ICD-10-CM | POA: Insufficient documentation

## 2012-01-18 DIAGNOSIS — Z79899 Other long term (current) drug therapy: Secondary | ICD-10-CM | POA: Insufficient documentation

## 2012-01-18 DIAGNOSIS — Z8559 Personal history of malignant neoplasm of other urinary tract organ: Secondary | ICD-10-CM | POA: Insufficient documentation

## 2012-01-18 DIAGNOSIS — E039 Hypothyroidism, unspecified: Secondary | ICD-10-CM | POA: Insufficient documentation

## 2012-01-18 DIAGNOSIS — I10 Essential (primary) hypertension: Secondary | ICD-10-CM | POA: Insufficient documentation

## 2012-01-18 DIAGNOSIS — D4959 Neoplasm of unspecified behavior of other genitourinary organ: Secondary | ICD-10-CM | POA: Insufficient documentation

## 2012-01-18 DIAGNOSIS — J449 Chronic obstructive pulmonary disease, unspecified: Secondary | ICD-10-CM | POA: Insufficient documentation

## 2012-01-18 DIAGNOSIS — E119 Type 2 diabetes mellitus without complications: Secondary | ICD-10-CM | POA: Insufficient documentation

## 2012-01-18 DIAGNOSIS — Z8551 Personal history of malignant neoplasm of bladder: Secondary | ICD-10-CM | POA: Insufficient documentation

## 2012-01-18 DIAGNOSIS — E7800 Pure hypercholesterolemia, unspecified: Secondary | ICD-10-CM | POA: Insufficient documentation

## 2012-01-18 DIAGNOSIS — D494 Neoplasm of unspecified behavior of bladder: Secondary | ICD-10-CM | POA: Insufficient documentation

## 2012-01-18 HISTORY — PX: CYSTOSCOPY W/ RETROGRADES: SHX1426

## 2012-01-18 LAB — POCT I-STAT 4, (NA,K, GLUC, HGB,HCT)
Hemoglobin: 13.6 g/dL (ref 12.0–15.0)
Potassium: 3.4 mEq/L — ABNORMAL LOW (ref 3.5–5.1)

## 2012-01-18 SURGERY — CYSTOSCOPY, WITH RETROGRADE PYELOGRAM
Anesthesia: General | Site: Ureter | Laterality: Bilateral | Wound class: Clean Contaminated

## 2012-01-18 MED ORDER — EPHEDRINE SULFATE 50 MG/ML IJ SOLN
INTRAMUSCULAR | Status: DC | PRN
  Administered 2012-01-18: 10 mg via INTRAVENOUS

## 2012-01-18 MED ORDER — CIPROFLOXACIN IN D5W 400 MG/200ML IV SOLN
400.0000 mg | INTRAVENOUS | Status: AC
  Administered 2012-01-18: 400 mg via INTRAVENOUS

## 2012-01-18 MED ORDER — SODIUM CHLORIDE 0.9 % IJ SOLN: 3.0000 mL | Freq: Two times a day (BID) | INTRAMUSCULAR | Status: DC

## 2012-01-18 MED ORDER — SODIUM CHLORIDE 0.9 % IV SOLN: 250.0000 mL | INTRAVENOUS | Status: DC | PRN

## 2012-01-18 MED ORDER — ACETAMINOPHEN 650 MG RE SUPP: 650.0000 mg | RECTAL | Status: DC | PRN

## 2012-01-18 MED ORDER — ONDANSETRON HCL 4 MG/2ML IJ SOLN
INTRAMUSCULAR | Status: DC | PRN
  Administered 2012-01-18: 4 mg via INTRAVENOUS

## 2012-01-18 MED ORDER — ONDANSETRON HCL 4 MG/2ML IJ SOLN: 4.0000 mg | Freq: Four times a day (QID) | INTRAMUSCULAR | Status: DC | PRN

## 2012-01-18 MED ORDER — PROPOFOL 10 MG/ML IV BOLUS
INTRAVENOUS | Status: DC | PRN
  Administered 2012-01-18: 150 mg via INTRAVENOUS

## 2012-01-18 MED ORDER — GLYCOPYRROLATE 0.2 MG/ML IJ SOLN
INTRAMUSCULAR | Status: DC | PRN
  Administered 2012-01-18: 0.2 mg via INTRAVENOUS

## 2012-01-18 MED ORDER — IOHEXOL 350 MG/ML SOLN
INTRAVENOUS | Status: DC | PRN
  Administered 2012-01-18: 15 mL

## 2012-01-18 MED ORDER — LIDOCAINE HCL (CARDIAC) 20 MG/ML IV SOLN
INTRAVENOUS | Status: DC | PRN
  Administered 2012-01-18: 75 mg via INTRAVENOUS

## 2012-01-18 MED ORDER — PHENAZOPYRIDINE HCL 200 MG PO TABS: 200.0000 mg | ORAL_TABLET | Freq: Three times a day (TID) | ORAL | Status: DC | PRN

## 2012-01-18 MED ORDER — PROMETHAZINE HCL 25 MG/ML IJ SOLN: 6.2500 mg | INTRAMUSCULAR | Status: DC | PRN

## 2012-01-18 MED ORDER — FENTANYL CITRATE 0.05 MG/ML IJ SOLN: 25.0000 ug | INTRAMUSCULAR | Status: DC | PRN

## 2012-01-18 MED ORDER — SODIUM CHLORIDE 0.9 % IR SOLN
Status: DC | PRN
  Administered 2012-01-18: 3000 mL

## 2012-01-18 MED ORDER — LACTATED RINGERS IV SOLN
INTRAVENOUS | Status: DC
  Administered 2012-01-18 (×2): via INTRAVENOUS

## 2012-01-18 MED ORDER — LACTATED RINGERS IV SOLN: INTRAVENOUS | Status: DC

## 2012-01-18 MED ORDER — FENTANYL CITRATE 0.05 MG/ML IJ SOLN
INTRAMUSCULAR | Status: DC | PRN
  Administered 2012-01-18 (×2): 25 ug via INTRAVENOUS

## 2012-01-18 MED ORDER — SODIUM CHLORIDE 0.9 % IJ SOLN: 3.0000 mL | INTRAMUSCULAR | Status: DC | PRN

## 2012-01-18 MED ORDER — ACETAMINOPHEN 325 MG PO TABS: 650.0000 mg | ORAL_TABLET | ORAL | Status: DC | PRN

## 2012-01-18 MED ORDER — OXYCODONE HCL 5 MG PO TABS: 5.0000 mg | ORAL_TABLET | ORAL | Status: DC | PRN

## 2012-01-18 SURGICAL SUPPLY — 19 items
BAG DRAIN URO-CYSTO SKYTR STRL (DRAIN) ×4 IMPLANT
BAG DRN UROCATH (DRAIN) ×3
CANISTER SUCT LVC 12 LTR MEDI- (MISCELLANEOUS) ×3 IMPLANT
CATH URET 5FR 28IN CONE TIP (BALLOONS)
CATH URET 5FR 28IN OPEN ENDED (CATHETERS) ×4 IMPLANT
CATH URET 5FR 70CM CONE TIP (BALLOONS) IMPLANT
CLOTH BEACON ORANGE TIMEOUT ST (SAFETY) ×4 IMPLANT
DRAPE CAMERA CLOSED 9X96 (DRAPES) ×4 IMPLANT
GLOVE INDICATOR 6.5 STRL GRN (GLOVE) ×6 IMPLANT
GLOVE SURG SS PI 8.0 STRL IVOR (GLOVE) ×4 IMPLANT
GOWN PREVENTION PLUS LG XLONG (DISPOSABLE) ×4 IMPLANT
GOWN STRL REIN XL XLG (GOWN DISPOSABLE) ×4 IMPLANT
GOWN XL W/COTTON TOWEL STD (GOWNS) ×4 IMPLANT
GUIDEWIRE 0.038 PTFE COATED (WIRE) IMPLANT
GUIDEWIRE ANG ZIPWIRE 038X150 (WIRE) IMPLANT
GUIDEWIRE STR DUAL SENSOR (WIRE) ×4 IMPLANT
IV NS IRRIG 3000ML ARTHROMATIC (IV SOLUTION) ×5 IMPLANT
NS IRRIG 500ML POUR BTL (IV SOLUTION) IMPLANT
PACK CYSTOSCOPY (CUSTOM PROCEDURE TRAY) ×4 IMPLANT

## 2012-01-18 NOTE — Addendum Note (Signed)
Addendum  created 01/18/12 1321 by Lashaya Kienitz M Malky Rudzinski, CRNA   Modules edited:Anesthesia Medication Administration    

## 2012-01-18 NOTE — Transfer of Care (Signed)
Immediate Anesthesia Transfer of Care Note  Patient: [name]  Procedure(s) Performed: Procedure(s) (LRB): CYSTOSCOPY WITH RETROGRADE PYELOGRAM (Bilateral)  Patient Location: Patient transported to PACU with oxygen via face mask at 4 Liters / Min  Anesthesia Type: General  Level of Consciousness: awake and alert   Airway & Oxygen Therapy: Patient Spontanous Breathing and Patient connected to face mask oxygen  Post-op Assessment: Report given to PACU RN and Post -op Vital signs reviewed and stable  Post vital signs: Reviewed and stable  Dentition: Teeth and oropharynx remain in pre-op condition  Complications: No apparent anesthesia complications

## 2012-01-18 NOTE — Interval H&P Note (Signed)
History and Physical Interval Note:  01/18/2012 8:32 AM  [name]  has presented today for surgery, with the diagnosis of History of Bladder and Left Ureteral Tumor  The various methods of treatment have been discussed with the patient and family. After consideration of risks, benefits and other options for treatment, the patient has consented to  Procedure(s) (LRB) with comments: CYSTOSCOPY/RETROGRADE/URETEROSCOPY (Bilateral) - 1 hour requested for this case (BIL) Retrograde Pyelograms POSS (LFT) Ureteroscopy  C-ARM   BLUE MEDICARE   CYSTOSCOPY WITH STENT PLACEMENT (N/A) as a surgical intervention .  [name] history has been reviewed, patient examined, no change in status, stable for surgery.  I have reviewed the patient's chart and labs.  Questions were answered to the patient's satisfaction.     Michelle Wnek J

## 2012-01-18 NOTE — Anesthesia Postprocedure Evaluation (Signed)
Anesthesia Post Note  Patient: [name]  Procedure(s) Performed: Procedure(s) (LRB): CYSTOSCOPY WITH RETROGRADE PYELOGRAM (Bilateral)  Anesthesia type: General  Patient location: PACU  Post pain: Pain level controlled  Post assessment: Post-op Vital signs reviewed  Last Vitals:  Filed Vitals:   01/18/12 0913  BP: 108/49  Pulse:   Temp: 36.1 C  Resp: 13    Post vital signs: Reviewed  Level of consciousness: sedated  Complications: No apparent anesthesia complications

## 2012-01-18 NOTE — Progress Notes (Signed)
Patient does not need a chest xray per Dr. Rica Mast

## 2012-01-18 NOTE — Anesthesia Preprocedure Evaluation (Signed)
Anesthesia Evaluation  Patient identified by MRN, date of birth, ID band Patient awake    Reviewed: Allergy & Precautions, H&P , NPO status , Patient's Chart, lab work & pertinent test results, reviewed documented beta blocker date and time   Airway Mallampati: II TM Distance: >3 FB Neck ROM: full    Dental No notable dental hx. (+) Teeth Intact and Dental Advisory Given   Pulmonary COPD breath sounds clear to auscultation  Pulmonary exam normal       Cardiovascular Exercise Tolerance: Good hypertension, Pt. on home beta blockers and Pt. on medications Rhythm:regular Rate:Normal     Neuro/Psych negative neurological ROS  negative psych ROS   GI/Hepatic negative GI ROS, Neg liver ROS,   Endo/Other  diabetes (Diet control), Well Controlled, Type 2Hypothyroidism   Renal/GU negative Renal ROS  negative genitourinary   Musculoskeletal   Abdominal   Peds  Hematology negative hematology ROS (+)   Anesthesia Other Findings   Reproductive/Obstetrics negative OB ROS                           Anesthesia Physical Anesthesia Plan  ASA: II  Anesthesia Plan: General   Post-op Pain Management:    Induction: Intravenous  Airway Management Planned: LMA  Additional Equipment:   Intra-op Plan:   Post-operative Plan: Extubation in OR  Informed Consent: I have reviewed the patients History and Physical, chart, labs and discussed the procedure including the risks, benefits and alternatives for the proposed anesthesia with the patient or authorized representative who has indicated his/her understanding and acceptance.   Dental advisory given  Plan Discussed with: CRNA  Anesthesia Plan Comments:         Anesthesia Quick Evaluation

## 2012-01-18 NOTE — Brief Op Note (Signed)
01/18/2012  9:06 AM  PATIENT:  [name]  78 y.o. female  PRE-OPERATIVE DIAGNOSIS:  History of Bladder and Left Ureteral Tumor  POST-OPERATIVE DIAGNOSIS: Same without recurrence  PROCEDURE:  Procedure(s) (LRB) with comments: CYSTOSCOPY/RETROGRADE/URETEROSCOPY (Bilateral) - 1 hour requested for this case (BIL) Retrograde Pyelograms   C-ARM   BLUE MEDICARE   CYSTOSCOPY WITH STENT PLACEMENT (N/A)  SURGEON:  Surgeon(s) and Role:    * Anner Crete, MD - Primary  PHYSICIAN ASSISTANT:   ASSISTANTS: none   ANESTHESIA:   general  EBL:  Total I/O In: 100 [I.V.:100] Out: -   BLOOD ADMINISTERED:none  DRAINS: none   LOCAL MEDICATIONS USED:  NONE  SPECIMEN:  No Specimen  DISPOSITION OF SPECIMEN:  N/A  COUNTS:  YES  TOURNIQUET:  * No tourniquets in log *  DICTATION: .Other Dictation: Dictation Number [id number]  PLAN OF CARE: Discharge to home after PACU  PATIENT DISPOSITION:  PACU - hemodynamically stable.   Delay start of Pharmacological VTE agent (>24hrs) due to surgical blood loss or risk of bleeding: not applicable

## 2012-01-18 NOTE — Addendum Note (Signed)
Addendum  created 01/18/12 1321 by Fran Lowes, CRNA   Modules edited:Anesthesia Medication Administration

## 2012-01-19 ENCOUNTER — Encounter (HOSPITAL_BASED_OUTPATIENT_CLINIC_OR_DEPARTMENT_OTHER): Payer: Self-pay | Admitting: Urology

## 2012-01-19 NOTE — Op Note (Signed)
NAMEPETA, PEACHEY                  ACCOUNT NO.:  [id number]  MEDICAL RECORD NO.:  [id number]  LOCATION:                               FACILITY:  [facility]  PHYSICIAN:  Excell Seltzer. Annabell Howells, M.D.    DATE OF BIRTH:  [birth year]  DATE OF PROCEDURE:  01/18/2012 DATE OF DISCHARGE:                              OPERATIVE REPORT   PROCEDURE:  Cystoscopy and bilateral retrograde pyelograms with interpretation.  PREOPERATIVE DIAGNOSIS:  History of low-grade non-muscle invasive bladder and left ureteral tumors.  POSTOPERATIVE DIAGNOSIS:  History of low-grade non-muscle invasive bladder and left ureteral tumors without recurrence.  SURGEON:  Excell Seltzer. Annabell Howells, MD  ANESTHESIA:  General.  SPECIMEN:  None.  DRAINS:  None.  COMPLICATIONS:  None.  INDICATIONS:  Ms. Olafson is an 78 year old white female who originally presented with a left distal ureteral tumor that was managed endoscopically.  She then subsequently developed multifocal, non-muscle invasive low-grade bladder tumors and smaller ureteral recurrence.  At that point, she was treated endoscopically and then had a stent left in and underwent induction BCG treatment.  This was followed by a maintenance BCG course with a left stent in place.  She returns now for surveillance endoscopy.  FINDINGS OF PROCEDURE:  She was given Cipro.  She was taken to the operating room where general anesthetic was induced.  She was placed in lithotomy position.  Her perineum and genitalia were prepped with Betadine solution, and she was draped in usual sterile fashion.  Cystoscopy was performed using the 22-French scope and 12-degree lens. Complete examination of the bladder was performed.  Examination revealed a normal urethra.  The bladder wall had mild trabeculation.  No mucosal lesions were noted other than scar from prior resection at the dome. The ureteral orifices were unremarkable.  The left ureteral orifice was cannulated with 5-French open-end  catheter and contrast was instilled.  This revealed an entirely normal ureter and intrarenal collecting system with no filling defects or stricturing.  The right retrograde pyelogram was performed with a 5-French open-end catheter and Omnipaque examination revealed a normal ureter and intrarenal collecting system without filling defects.  At this point, the bladder was drained, cystoscope was removed.  The patient was taken down from the lithotomy position.  Her anesthetic was reversed.  She was moved to the recovery room in stable condition. There were no complications.     Excell Seltzer. Annabell Howells, M.D.     JJW/MEDQ  D:  01/18/2012  T:  01/18/2012  Job:  [id number]

## 2012-05-05 ENCOUNTER — Other Ambulatory Visit: Payer: Self-pay | Admitting: Urology

## 2012-05-05 ENCOUNTER — Ambulatory Visit (INDEPENDENT_AMBULATORY_CARE_PROVIDER_SITE_OTHER): Payer: Medicare Other | Admitting: Urology

## 2012-05-05 DIAGNOSIS — R3129 Other microscopic hematuria: Secondary | ICD-10-CM

## 2012-05-05 DIAGNOSIS — Z8551 Personal history of malignant neoplasm of bladder: Secondary | ICD-10-CM

## 2012-05-09 ENCOUNTER — Ambulatory Visit (HOSPITAL_COMMUNITY)
Admission: RE | Admit: 2012-05-09 | Discharge: 2012-05-09 | Disposition: A | Discharge status: Home / Self Care | Payer: Medicare Other | Source: Ambulatory Visit | Attending: Urology | Admitting: Urology

## 2012-05-09 DIAGNOSIS — K8020 Calculus of gallbladder without cholecystitis without obstruction: Secondary | ICD-10-CM | POA: Insufficient documentation

## 2012-05-09 DIAGNOSIS — N83209 Unspecified ovarian cyst, unspecified side: Secondary | ICD-10-CM | POA: Insufficient documentation

## 2012-05-09 DIAGNOSIS — Z85528 Personal history of other malignant neoplasm of kidney: Secondary | ICD-10-CM | POA: Insufficient documentation

## 2012-05-09 DIAGNOSIS — R933 Abnormal findings on diagnostic imaging of other parts of digestive tract: Secondary | ICD-10-CM | POA: Insufficient documentation

## 2012-05-09 DIAGNOSIS — R319 Hematuria, unspecified: Secondary | ICD-10-CM | POA: Insufficient documentation

## 2012-05-09 DIAGNOSIS — I10 Essential (primary) hypertension: Secondary | ICD-10-CM | POA: Insufficient documentation

## 2012-05-09 DIAGNOSIS — R3129 Other microscopic hematuria: Secondary | ICD-10-CM

## 2012-05-09 DIAGNOSIS — R911 Solitary pulmonary nodule: Secondary | ICD-10-CM | POA: Insufficient documentation

## 2012-05-09 MED ORDER — IOHEXOL 300 MG/ML  SOLN
100.0000 mL | Freq: Once | INTRAMUSCULAR | Status: AC | PRN
  Administered 2012-05-09: 100 mL via INTRAVENOUS

## 2012-05-12 ENCOUNTER — Ambulatory Visit (INDEPENDENT_AMBULATORY_CARE_PROVIDER_SITE_OTHER): Payer: Medicare Other | Admitting: Internal Medicine

## 2012-05-12 ENCOUNTER — Encounter (HOSPITAL_COMMUNITY): Payer: Self-pay | Admitting: Pharmacy Technician

## 2012-05-12 ENCOUNTER — Other Ambulatory Visit (INDEPENDENT_AMBULATORY_CARE_PROVIDER_SITE_OTHER): Payer: Self-pay | Admitting: *Deleted

## 2012-05-12 ENCOUNTER — Encounter (INDEPENDENT_AMBULATORY_CARE_PROVIDER_SITE_OTHER): Payer: Self-pay | Admitting: Internal Medicine

## 2012-05-12 ENCOUNTER — Encounter (INDEPENDENT_AMBULATORY_CARE_PROVIDER_SITE_OTHER): Payer: Self-pay | Admitting: *Deleted

## 2012-05-12 VITALS — BP 102/70 | HR 64 | Temp 98.0°F | Ht 62.0 in | Wt 116.3 lb

## 2012-05-12 DIAGNOSIS — C679 Malignant neoplasm of bladder, unspecified: Secondary | ICD-10-CM | POA: Insufficient documentation

## 2012-05-12 DIAGNOSIS — K317 Polyp of stomach and duodenum: Secondary | ICD-10-CM

## 2012-05-12 DIAGNOSIS — D131 Benign neoplasm of stomach: Secondary | ICD-10-CM

## 2012-05-12 DIAGNOSIS — E039 Hypothyroidism, unspecified: Secondary | ICD-10-CM | POA: Insufficient documentation

## 2012-05-12 DIAGNOSIS — I10 Essential (primary) hypertension: Secondary | ICD-10-CM | POA: Insufficient documentation

## 2012-05-12 DIAGNOSIS — E7800 Pure hypercholesterolemia, unspecified: Secondary | ICD-10-CM | POA: Insufficient documentation

## 2012-05-12 DIAGNOSIS — E119 Type 2 diabetes mellitus without complications: Secondary | ICD-10-CM | POA: Insufficient documentation

## 2012-05-12 DIAGNOSIS — C649 Malignant neoplasm of unspecified kidney, except renal pelvis: Secondary | ICD-10-CM | POA: Insufficient documentation

## 2012-05-12 NOTE — Progress Notes (Signed)
Subjective:     Patient ID: [name], female   DOB: 03-02-1923, 79 y.o.   MRN: [id number]  HPI Referred to our office by Dr. Annabell Howells for an abnormal CT scan.  Ct scan revealed 2.5cm enhancing and pedunculated polypoid mass in the anterior stomach. Endoscopy resection recommended. No renal mass or specific causefor hematuria is currently observed. She denies any problems. Her appetite is good.  There has been no weight loss. No abdominal pain. She usually has a BM about once a day. No melena or bright red rectal bleeding. She tells me for her age she feels good. She has no pain.     She has a history of left distal ureteral low-grade urothelial carcinoma, then  underwent resection. She on subsequent followup developed bladder  cancer and recurrence in the ureter. These were treated with  fulguration and resection and then she underwent BCG with the left stent  indwelling. Her stent was removed.   Review of Systems see hpi Current Outpatient Prescriptions  Medication Sig Dispense Refill  . amLODipine (NORVASC) 10 MG tablet Take 10 mg by mouth every morning.       Marland Kitchen aspirin 81 MG tablet Take 160 mg by mouth daily.      . hydrochlorothiazide (HYDRODIURIL) 25 MG tablet Take 25 mg by mouth daily.      Marland Kitchen levothyroxine (SYNTHROID, LEVOTHROID) 50 MCG tablet Take 50 mcg by mouth every morning.       . metoprolol (LOPRESSOR) 50 MG tablet Take 50 mg by mouth 2 (two) times daily.      . potassium chloride SA (K-DUR,KLOR-CON) 20 MEQ tablet Take 20 mEq by mouth daily.      . simvastatin (ZOCOR) 20 MG tablet Take 20 mg by mouth every evening.      . vitamin C (ASCORBIC ACID) 500 MG tablet Take 500 mg by mouth daily.       Past Medical History  Diagnosis Date  . Hypertension   . Hyperlipemia   . COPD (chronic obstructive pulmonary disease)   . Hypothyroidism   . Frequency of urination   . Incontinence of urine   . Diabetes mellitus type 2, diet-controlled     CBG CHECK 3 TIMES WKLY  . History  of malignant neoplasm of ureter LEFT URETER TUMOR FEB 2012    S/P REMOVAL 06-2010  AND FULGURATION FEB 2013  . History of transurethral destruction of bladder lesion S/P  BX'S AND FULGURATION'S OF TUMORS  . Microhematuria   . Nocturia    Past Surgical History  Procedure Date  . Cataract extraction w/ intraocular lens  implant, bilateral   . Left ureteroscopy with fulguration of tumor/ removal and replacement left ureteral stent 06-24-2010    MALIGNANT NEOPLASM LEFT URETER  . Cysto/ left ureteroscopy/ bx ureteral tumor/ stent placement 05-19-2010  . Cysto/ bilateral retrogradce pyelogram/ left ureteroscopy with fulguration of tumor/ bladder bx and fulgeration of lesion's and tumor 05-18-2011  DR [name] [facility])    HX LEFT URETER TUMOR AND BLADDER TUMORS  . Cystoscopy/retrograde/ureteroscopy 10/14/2011    Procedure: CYSTOSCOPY/RETROGRADE/URETEROSCOPY;  Surgeon: Anner Crete, MD;  Location: [facility];  Service: Urology;  Laterality: Left;      . Cystoscopy w/ retrogrades 01/18/2012    Procedure: CYSTOSCOPY WITH RETROGRADE PYELOGRAM;  Surgeon: Anner Crete, MD;  Location: [facility];  Service: Urology;  Laterality: Bilateral;   Allergies  Allergen Reactions  . Latex Rash       Objective:  Physical Exam Filed Vitals:   05/12/12 1005  BP: 102/70  Pulse: 64  Temp: 98 F (36.7 C)  Height: 5\' 2"  (1.575 m)  Weight: 116 lb 4.8 oz (52.753 kg)   Alert and oriented. Skin warm and dry. Oral mucosa is moist.   . Sclera anicteric, conjunctivae is pink. Thyroid not enlarged. No cervical lymphadenopathy. Lungs clear. Heart regular rate and rhythm.  Abdomen is soft. Bowel sounds are positive. No hepatomegaly. No abdominal masses felt. No tenderness.  No edema to lower extremities. Patient is alert and oriented.      Assessment:    Stomach polyp not seen in CT in 2012. Needs biopsy.    Plan:    EGD with biopsy.

## 2012-05-12 NOTE — Patient Instructions (Addendum)
EGD with Dr Karilyn Cota next week

## 2012-05-15 ENCOUNTER — Encounter (INDEPENDENT_AMBULATORY_CARE_PROVIDER_SITE_OTHER): Payer: Self-pay | Admitting: *Deleted

## 2012-05-15 NOTE — Progress Notes (Signed)
This encounter was created in error - please disregard.

## 2012-05-16 ENCOUNTER — Encounter (HOSPITAL_COMMUNITY)
Admission: RE | Disposition: A | Discharge status: Home / Self Care | Payer: Self-pay | Source: Ambulatory Visit | Attending: Internal Medicine

## 2012-05-16 ENCOUNTER — Observation Stay (HOSPITAL_COMMUNITY)
Admission: RE | Admit: 2012-05-16 | Discharge: 2012-05-17 | DRG: 375 | Disposition: A | Discharge status: Home / Self Care | Payer: Medicare Other | Source: Ambulatory Visit | Attending: Internal Medicine | Admitting: Internal Medicine

## 2012-05-16 ENCOUNTER — Encounter (HOSPITAL_COMMUNITY): Payer: Self-pay | Admitting: *Deleted

## 2012-05-16 DIAGNOSIS — R933 Abnormal findings on diagnostic imaging of other parts of digestive tract: Secondary | ICD-10-CM

## 2012-05-16 DIAGNOSIS — Z79899 Other long term (current) drug therapy: Secondary | ICD-10-CM

## 2012-05-16 DIAGNOSIS — D131 Benign neoplasm of stomach: Secondary | ICD-10-CM

## 2012-05-16 DIAGNOSIS — K449 Diaphragmatic hernia without obstruction or gangrene: Secondary | ICD-10-CM

## 2012-05-16 DIAGNOSIS — J4489 Other specified chronic obstructive pulmonary disease: Secondary | ICD-10-CM | POA: Diagnosis present

## 2012-05-16 DIAGNOSIS — C169 Malignant neoplasm of stomach, unspecified: Principal | ICD-10-CM | POA: Diagnosis present

## 2012-05-16 DIAGNOSIS — IMO0002 Reserved for concepts with insufficient information to code with codable children: Secondary | ICD-10-CM

## 2012-05-16 DIAGNOSIS — Z8559 Personal history of malignant neoplasm of other urinary tract organ: Secondary | ICD-10-CM

## 2012-05-16 DIAGNOSIS — E785 Hyperlipidemia, unspecified: Secondary | ICD-10-CM | POA: Diagnosis present

## 2012-05-16 DIAGNOSIS — E039 Hypothyroidism, unspecified: Secondary | ICD-10-CM | POA: Diagnosis present

## 2012-05-16 DIAGNOSIS — J449 Chronic obstructive pulmonary disease, unspecified: Secondary | ICD-10-CM | POA: Diagnosis present

## 2012-05-16 DIAGNOSIS — K317 Polyp of stomach and duodenum: Secondary | ICD-10-CM

## 2012-05-16 DIAGNOSIS — Z8551 Personal history of malignant neoplasm of bladder: Secondary | ICD-10-CM

## 2012-05-16 DIAGNOSIS — I10 Essential (primary) hypertension: Secondary | ICD-10-CM | POA: Diagnosis present

## 2012-05-16 DIAGNOSIS — E119 Type 2 diabetes mellitus without complications: Secondary | ICD-10-CM | POA: Diagnosis present

## 2012-05-16 HISTORY — PX: ESOPHAGOGASTRODUODENOSCOPY: SHX5428

## 2012-05-16 HISTORY — PX: BIOPSY: SHX5522

## 2012-05-16 LAB — CBC
HCT: 34.9 % — ABNORMAL LOW (ref 36.0–46.0)
Hemoglobin: 11.7 g/dL — ABNORMAL LOW (ref 12.0–15.0)
RBC: 4.06 MIL/uL (ref 3.87–5.11)
RDW: 14.2 % (ref 11.5–15.5)
WBC: 11.3 10*3/uL — ABNORMAL HIGH (ref 4.0–10.5)

## 2012-05-16 LAB — GLUCOSE, CAPILLARY

## 2012-05-16 LAB — COMPREHENSIVE METABOLIC PANEL
Alkaline Phosphatase: 50 U/L (ref 39–117)
BUN: 15 mg/dL (ref 6–23)
Calcium: 9.1 mg/dL (ref 8.4–10.5)
Creatinine, Ser: 1.11 mg/dL — ABNORMAL HIGH (ref 0.50–1.10)
GFR calc Af Amer: 50 mL/min — ABNORMAL LOW (ref >90)
Glucose, Bld: 150 mg/dL — ABNORMAL HIGH (ref 70–99)
Total Protein: 7 g/dL (ref 6.0–8.3)

## 2012-05-16 LAB — PROTIME-INR
INR: 1.2 (ref 0.00–1.49)
Prothrombin Time: 15 seconds (ref 11.6–15.2)

## 2012-05-16 LAB — HEMOGLOBIN AND HEMATOCRIT, BLOOD
HCT: 34.3 % — ABNORMAL LOW (ref 36.0–46.0)
Hemoglobin: 11.5 g/dL — ABNORMAL LOW (ref 12.0–15.0)

## 2012-05-16 SURGERY — EGD (ESOPHAGOGASTRODUODENOSCOPY)
Anesthesia: Moderate Sedation

## 2012-05-16 MED ORDER — DEXTROSE-NACL 5-0.9 % IV SOLN: INTRAVENOUS | Status: DC

## 2012-05-16 MED ORDER — SUCRALFATE 1 GM/10ML PO SUSP
1.0000 g | Freq: Three times a day (TID) | ORAL | Status: DC
  Administered 2012-05-16 – 2012-05-17 (×2): 1 g via ORAL
  Filled 2012-05-16 (×2): qty 10

## 2012-05-16 MED ORDER — AMLODIPINE BESYLATE 5 MG PO TABS
10.0000 mg | ORAL_TABLET | Freq: Every morning | ORAL | Status: DC
  Administered 2012-05-17: 10 mg via ORAL
  Filled 2012-05-16: qty 2

## 2012-05-16 MED ORDER — ONDANSETRON HCL 4 MG/2ML IJ SOLN: 4.0000 mg | Freq: Four times a day (QID) | INTRAMUSCULAR | Status: DC | PRN

## 2012-05-16 MED ORDER — MEPERIDINE HCL 25 MG/ML IJ SOLN
INTRAMUSCULAR | Status: DC | PRN
  Administered 2012-05-16: 15 mg via INTRAVENOUS

## 2012-05-16 MED ORDER — SODIUM CHLORIDE 0.45 % IV SOLN
INTRAVENOUS | Status: DC
  Administered 2012-05-16: 07:00:00 via INTRAVENOUS

## 2012-05-16 MED ORDER — SODIUM CHLORIDE 0.9 % IJ SOLN
INTRAMUSCULAR | Status: DC | PRN
  Administered 2012-05-16 (×2)

## 2012-05-16 MED ORDER — MIDAZOLAM HCL 5 MG/5ML IJ SOLN
INTRAMUSCULAR | Status: DC | PRN
  Administered 2012-05-16 (×2): 1 mg via INTRAVENOUS
  Administered 2012-05-16: 2 mg via INTRAVENOUS

## 2012-05-16 MED ORDER — EPINEPHRINE HCL 0.1 MG/ML IJ SOLN
INTRAMUSCULAR | Status: AC
  Filled 2012-05-16: qty 10

## 2012-05-16 MED ORDER — BUTAMBEN-TETRACAINE-BENZOCAINE 2-2-14 % EX AERO
INHALATION_SPRAY | CUTANEOUS | Status: DC | PRN
  Administered 2012-05-16: 2 via TOPICAL

## 2012-05-16 MED ORDER — METOPROLOL TARTRATE 50 MG PO TABS
50.0000 mg | ORAL_TABLET | Freq: Two times a day (BID) | ORAL | Status: DC
  Administered 2012-05-17: 50 mg via ORAL
  Filled 2012-05-16 (×2): qty 1

## 2012-05-16 MED ORDER — MEPERIDINE HCL 50 MG/ML IJ SOLN
INTRAMUSCULAR | Status: AC
  Filled 2012-05-16: qty 1

## 2012-05-16 MED ORDER — SODIUM CHLORIDE 0.9 % IJ SOLN
3.0000 mL | Freq: Two times a day (BID) | INTRAMUSCULAR | Status: DC
  Administered 2012-05-16: 3 mL via INTRAVENOUS

## 2012-05-16 MED ORDER — LEVOTHYROXINE SODIUM 50 MCG PO TABS
50.0000 ug | ORAL_TABLET | Freq: Every morning | ORAL | Status: DC
  Administered 2012-05-17: 50 ug via ORAL
  Filled 2012-05-16: qty 1

## 2012-05-16 MED ORDER — ONDANSETRON HCL 4 MG PO TABS: 4.0000 mg | ORAL_TABLET | Freq: Four times a day (QID) | ORAL | Status: DC | PRN

## 2012-05-16 MED ORDER — STERILE WATER FOR IRRIGATION IR SOLN
Status: DC | PRN
  Administered 2012-05-16: 08:00:00

## 2012-05-16 MED ORDER — HYDROCHLOROTHIAZIDE 25 MG PO TABS
25.0000 mg | ORAL_TABLET | Freq: Every day | ORAL | Status: DC
  Administered 2012-05-17: 25 mg via ORAL
  Filled 2012-05-16: qty 1

## 2012-05-16 MED ORDER — PANTOPRAZOLE SODIUM 40 MG IV SOLR
40.0000 mg | Freq: Once | INTRAVENOUS | Status: AC
  Administered 2012-05-16: 40 mg via INTRAVENOUS
  Filled 2012-05-16: qty 40

## 2012-05-16 MED ORDER — MIDAZOLAM HCL 5 MG/5ML IJ SOLN
INTRAMUSCULAR | Status: AC
  Filled 2012-05-16: qty 10

## 2012-05-16 MED ORDER — EPINEPHRINE HCL 0.1 MG/ML IJ SOLN
INTRAMUSCULAR | Status: AC
  Filled 2012-05-16: qty 20

## 2012-05-16 MED ORDER — SODIUM CHLORIDE 0.9 % IJ SOLN
INTRAMUSCULAR | Status: AC
  Filled 2012-05-16: qty 10

## 2012-05-16 MED ORDER — SIMVASTATIN 20 MG PO TABS: 20.0000 mg | ORAL_TABLET | Freq: Every evening | ORAL | Status: DC

## 2012-05-16 MED ORDER — SODIUM CHLORIDE 0.9 % IV SOLN
INTRAVENOUS | Status: DC
  Administered 2012-05-16: 20 mL/h via INTRAVENOUS
  Administered 2012-05-17: 09:00:00 via INTRAVENOUS

## 2012-05-16 NOTE — Op Note (Addendum)
EGD PROCEDURE REPORT  PATIENT:  [name]  MR#:  [id number] Birthdate:  [birth year], 79 y.o., female Endoscopist:  Dr. Malissa Hippo, MD Referred By:  Dr. Excell Seltzer. Annabell Howells, MD Procedure Date: 05/16/2012  Procedure:   EGD with gastric polypectomy. Hemoclip injection to polypectomy site along with epi injection.  Indications:  Patient is 79 year old Caucasian female who was found to have large gastric polyp on a CT done for urologic indication.            Informed Consent:  The risks, benefits, alternatives & imponderables which include, but are not limited to, bleeding, infection, perforation, drug reaction and potential missed lesion have been reviewed.  The potential for biopsy, lesion removal, esophageal dilation, etc. have also been discussed.  Questions have been answered.  All parties agreeable.  Please see history & physical in medical record for more information.  Medications:  Demerol 15 mg IV Versed 4 mg IV Cetacaine spray topically for oropharyngeal anesthesia  Description of procedure:  The endoscope was introduced through the mouth and advanced to the second portion of the duodenum without difficulty or limitations. The mucosal surfaces were surveyed very carefully during advancement of the scope and upon withdrawal.  Findings:  Esophagus:  Mucosa of the esophagus was normal. Wavy GE junction. GEJ:  37 cm Hiatus:  39 cm Stomach:  Stomach was empty with multiple 3-4 mm polyps in gastric body and antrum. Some of these with eroded surface. It was 4 cm size ulcerated polyp at gastric body with thick stalk. Another 10 mm flat submucosal lesion noted at gastric antrum. Pyloric channel was patent. Endoscope was retroflexed to examine angularis fundus and cardia. Angularis was unremarkable and multiple small polyps are noted at fundus. Duodenum:  Normal bulbar and post bulbar mucosa.  Therapeutic/Diagnostic Maneuvers Performed:  2 hemoclips applied at the base of stalk on either side.  Polypectomy performed. There was continuous ooze from polypectomy site 3 more clips were applied along with dilute appendectomy injection. 5 ml was injected and hemostasis secured. Polyp at her treat using polyp retrieval device.  Complications:  Polypectomy bleed controlled with combination of Hemoclip application and dilute epi injection.  Impression: Small sliding heart hernia. Multiple 3-4 mm gastric polyps at body and fundus. 10 mm flat submucosal lesion at antrum. 4 cm ulcerated polyp snared from gastric body. Post-polypectomy bleed controlled with Hemoclip application and dilute epi injection.  Recommendations:  Patient will be hospitalized for monitoring and she would undergo repeat EGD in a.m.  REHMAN,NAJEEB U  05/16/2012  8:38 AM  CC: Dr. Carylon Perches, MD & Dr. Bonnetta Barry ref. provider found         Dr. Excell Seltzer. Annabell Howells, MD

## 2012-05-16 NOTE — Progress Notes (Signed)
Patient has no complaints. She is hungry. She had no difficulty with clear liquids. She denies abdominal pain or melena. Lab studies reviewed. H&H is 11.7 and 34.9. Will recheck H&H at 4 PM. Repeat EGD in am.

## 2012-05-16 NOTE — H&P (Signed)
[name] is an 79 y.o. female.   Chief Complaint: Patient is here for EGD and gastric polypectomy. HPI: Patient is 79 year old Caucasian female who has history of left ureter and bladder cancer who underwent abdominopelvic CT with contrast because of microscopic hematuria. She was found to have 2.5 mm gastric polyp. She was therefore referred for evaluation by Dr. Excell Seltzer. [name]. Patient denies abdominal pain melena or rectal bleeding nausea vomiting anorexia or weight loss. There is no history of peptic ulcer disease.  Past Medical History  Diagnosis Date  . Hypertension   . Hyperlipemia   . COPD (chronic obstructive pulmonary disease)   . Hypothyroidism   . Frequency of urination   . Incontinence of urine   . Diabetes mellitus type 2, diet-controlled     CBG CHECK 3 TIMES WKLY  . 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 History of transurethral destruction of bladder lesion S/P  BX'S AND FULGURATION'S OF TUMORS  . Microhematuria   . Nocturia     Past Surgical History  Procedure Laterality Date  . Cataract extraction w/ intraocular lens  implant, bilateral    . Left ureteroscopy with fulguration of tumor/ removal and replacement left ureteral stent  06-24-2010    MALIGNANT NEOPLASM LEFT URETER  . Cysto/ left ureteroscopy/ bx ureteral tumor/ stent placement  05-19-2010  . Cysto/ bilateral retrogradce pyelogram/ left ureteroscopy with fulguration of tumor/ bladder bx and fulgeration of lesion's and tumor  05-18-2011  DR [name])    HX LEFT URETER TUMOR AND BLADDER TUMORS  . Cystoscopy/retrograde/ureteroscopy  10/14/2011    Procedure: CYSTOSCOPY/RETROGRADE/URETEROSCOPY;  Surgeon: Anner Crete, MD;  Location: [facility];  Service: Urology;  Laterality: Left;      . Cystoscopy w/ retrogrades  01/18/2012    Procedure: CYSTOSCOPY WITH RETROGRADE PYELOGRAM;  Surgeon: Anner Crete, MD;   Location: [facility];  Service: Urology;  Laterality: Bilateral;    History reviewed. No pertinent family history. Social History:  reports that she has never smoked. She does not have any smokeless tobacco history on file. She reports that she does not drink alcohol or use illicit drugs.  Allergies:  Allergies  Allergen Reactions  . Latex Rash    Medications Prior to Admission  Medication Sig Dispense Refill  . amLODipine (NORVASC) 10 MG tablet Take 10 mg by mouth every morning.       Marland Kitchen aspirin 81 MG tablet Take 160 mg by mouth daily.      . hydrochlorothiazide (HYDRODIURIL) 25 MG tablet Take 25 mg by mouth daily.      Marland Kitchen levothyroxine (SYNTHROID, LEVOTHROID) 50 MCG tablet Take 50 mcg by mouth every morning.       . metoprolol (LOPRESSOR) 50 MG tablet Take 50 mg by mouth 2 (two) times daily.      . potassium chloride SA (K-DUR,KLOR-CON) 20 MEQ tablet Take 20 mEq by mouth daily.      . simvastatin (ZOCOR) 20 MG tablet Take 20 mg by mouth every evening.      . vitamin C (ASCORBIC ACID) 500 MG tablet Take 500 mg by mouth daily.        Results for orders placed during the hospital encounter of 05/16/12 (from the past 48 hour(s))  GLUCOSE, CAPILLARY     Status: None   Collection Time    05/16/12  7:23 AM  Result Value Range   Glucose-Capillary 89  70 - 99 mg/dL   No results found.  ROS  Pulse 59, temperature 98 F (36.7 C), temperature source Oral, resp. rate 18, SpO2 93.00%. Physical Exam  Vitals reviewed. Constitutional: She appears well-developed and well-nourished.  HENT:  Mouth/Throat: Oropharynx is clear and moist.  Eyes: Conjunctivae are normal. No scleral icterus.  Neck: No thyromegaly present.  Cardiovascular: Normal rate, regular rhythm and normal heart sounds.   No murmur heard. Respiratory: Effort normal and breath sounds normal.  GI: Soft. She exhibits no distension and no mass. There is no tenderness.  Musculoskeletal: She exhibits no  edema.  Lymphadenopathy:    She has no cervical adenopathy.  Neurological: She is alert.  Skin: Skin is warm and dry.    Abdominopelvic CT reviewed. It reveals large gastric polyp along the greater curvature. She also has extensive atherosclerotic changes to aorta.  Assessment/Plan Large gastric polyp without GI symptoms. EGD with gastric polypectomy. Procedure and risks reviewed with the patient and she is agreeable.  [name] U 05/16/2012, 7:40 AM

## 2012-05-17 ENCOUNTER — Encounter (HOSPITAL_COMMUNITY): Payer: Self-pay | Admitting: *Deleted

## 2012-05-17 ENCOUNTER — Encounter (HOSPITAL_COMMUNITY)
Admission: RE | Disposition: A | Discharge status: Home / Self Care | Payer: Self-pay | Source: Ambulatory Visit | Attending: Internal Medicine

## 2012-05-17 DIAGNOSIS — D131 Benign neoplasm of stomach: Secondary | ICD-10-CM

## 2012-05-17 DIAGNOSIS — K929 Disease of digestive system, unspecified: Secondary | ICD-10-CM

## 2012-05-17 HISTORY — PX: ESOPHAGOGASTRODUODENOSCOPY: SHX5428

## 2012-05-17 LAB — CBC
HCT: 35.7 % — ABNORMAL LOW (ref 36.0–46.0)
MCV: 85.6 fL (ref 78.0–100.0)
Platelets: 214 10*3/uL (ref 150–400)
RBC: 4.17 MIL/uL (ref 3.87–5.11)
RDW: 14.4 % (ref 11.5–15.5)
WBC: 7.3 10*3/uL (ref 4.0–10.5)

## 2012-05-17 LAB — GLUCOSE, CAPILLARY: Glucose-Capillary: 97 mg/dL (ref 70–99)

## 2012-05-17 SURGERY — EGD (ESOPHAGOGASTRODUODENOSCOPY)
Anesthesia: Moderate Sedation

## 2012-05-17 MED ORDER — PANTOPRAZOLE SODIUM 40 MG PO TBEC: 40.0000 mg | DELAYED_RELEASE_TABLET | Freq: Two times a day (BID) | ORAL | Status: DC

## 2012-05-17 MED ORDER — MEPERIDINE HCL 50 MG/ML IJ SOLN
INTRAMUSCULAR | Status: AC
  Filled 2012-05-17: qty 1

## 2012-05-17 MED ORDER — MIDAZOLAM HCL 5 MG/5ML IJ SOLN
INTRAMUSCULAR | Status: AC
  Filled 2012-05-17: qty 10

## 2012-05-17 MED ORDER — SIMETHICONE 40 MG/0.6ML PO SUSP
ORAL | Status: DC | PRN
  Administered 2012-05-17: 09:00:00

## 2012-05-17 MED ORDER — SUCRALFATE 1 G PO TABS: 1.0000 g | ORAL_TABLET | Freq: Four times a day (QID) | ORAL | Status: DC

## 2012-05-17 MED ORDER — BUTAMBEN-TETRACAINE-BENZOCAINE 2-2-14 % EX AERO
INHALATION_SPRAY | CUTANEOUS | Status: DC | PRN
  Administered 2012-05-17: 2 via TOPICAL

## 2012-05-17 MED ORDER — MIDAZOLAM HCL 5 MG/5ML IJ SOLN
INTRAMUSCULAR | Status: DC | PRN
  Administered 2012-05-17: 1 mg via INTRAVENOUS
  Administered 2012-05-17: 2 mg via INTRAVENOUS

## 2012-05-17 NOTE — Progress Notes (Signed)
Discharge instructions given to patient with no new questions. Explained per Dr. Karilyn Cota to resume aspirin 05/28/12 and to take carafate x2 weeks.patient left in stable condition with husband.

## 2012-05-17 NOTE — Progress Notes (Signed)
Biopsy results reviewed with patient and her husband. 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 Resection margin clear. There may be foci of low grade neuroendocrine tumor and intestinal metaplasia. It appears this lesion has been completely resected. Patient is doing fine and will be going home. Will check H. pylori serology. Office visit in one month. She will continue to hold aspirin for 10 days.

## 2012-05-17 NOTE — Op Note (Signed)
EGD PROCEDURE REPORT  PATIENT:  [name]  MR#:  [id number] Birthdate:  Apr 15, 1922, 79 y.o., female Endoscopist:  Dr. Malissa Hippo, MD Referred By:  Dr. Excell Seltzer. Annabell Howells, MD Procedure Date: 05/17/2012  Procedure:   EGD  Indications:  Patient is 79 year old Caucasian female who underwent gastric polypectomy with removal of 4 cm gastric polyp with post-polypectomy bleed controlled with combination of Hemoclip application and injection therapy. Her H&H has remained stable. She is undergoing repeat examination to make sure there is no evidence of recurrent bleed. Hemoglobin on admission was 11.7 and is 11.9 this morning            Informed Consent:  The risks, benefits, alternatives & imponderables which include, but are not limited to, bleeding, infection, perforation, drug reaction and potential missed lesion have been reviewed.  The potential for biopsy, lesion removal, esophageal dilation, etc. have also been discussed.  Questions have been answered.  All parties agreeable.  Please see history & physical in medical record for more information.  Medications:  Versed 3 mg IV Cetacaine spray topically for oropharyngeal anesthesia  Description of procedure:  The endoscope was introduced through the mouth and advanced to the second portion of the duodenum without difficulty or limitations. The mucosal surfaces were surveyed very carefully during advancement of the scope and upon withdrawal.  Findings:  Esophagus:  Mucosa of the esophagus was normal. GEJ:  37 cm Hiatus:  39 cm Stomach:  Stomach was empty and distended very well with insufflation. Once again multiple small polyps are identified at gastric body and fundus along with 10 mm flat submucosal lesion at antrum. Polypectomy site with 5 hemoclips. No bleeding noted from polypectomy site. It was washed to get water and observe for few minutes. Pyloric channel was patent and angularis fundus and cardia were examined without any additional  findings. Duodenum:  Not examined.  Therapeutic/Diagnostic Maneuvers Performed:  None  Complications:  None  Impression: Small sliding hiatal hernia. No stigmata of bleeding from polypectomy site with 5 Hemoclips in place. Multiple small hyperplastic-appearing gastric polyps. 10 mm submucosal lesion at antrum possibly a leiomyoma. This lesion was left alone.  Recommendations:  Advance diet. Home later today. I will be contacting patient with results of biopsy and further recommendations.  REHMAN,NAJEEB U  05/17/2012  9:23 AM  CC: Dr. Carylon Perches, MD & Dr. Bonnetta Barry ref. provider found         Dr. Excell Seltzer. Annabell Howells, MD

## 2012-05-19 ENCOUNTER — Encounter (HOSPITAL_COMMUNITY): Payer: Self-pay | Admitting: Internal Medicine

## 2012-05-19 LAB — H. PYLORI ANTIBODY, IGG: H Pylori IgG: 0.6 {ISR}

## 2012-05-19 NOTE — Discharge Summary (Addendum)
Date of admission 05/16/2012. Date of discharge  05/17/2012.  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 Hypertension. Hypothyroidism. Hyperlipidemia. Diabetes mellitus type 2 diet controlled.  Condition at the time of discharge; Stable.  Principal procedures. EGD with gastric polypectomy on 05/16/2012(prior to admission). EGD on 05/17/2012.  Discharge medications. Sucralfate 1 g by mouth 4 times a day for 2 weeks. Pantoprazole 40 mg by mouth twice a day. Amlodipine 10 mg by mouth daily. HCTZ 25 mg by mouth daily. Metoprolol 50 mg by mouth twice a day. Levothyroxine 50 mcg by mouth daily. Vitamin C 500 mg by mouth daily.  Hospital course. Patient is 79 year old Caucasian female who was found to have large gastric polyp on a CT done for urologic indication. She underwent elective esophagogastroduodenoscopy with gastric polypectomy. She had 4 cm size ulcerated polyp on a thick stalk. Two hemoclips were applied to the base prior to polypectomy. She had bleed from polypectomy easily controlled with application of two more clips and dilute epinephrine injection. I recommended she be hospitalized to make sure she does not have further bleeding. She was begun on IV PPI and  typed and crossmatched. Admission hemoglobin was 11.7 g. Hemoglobin was repeated later that afternoon was 11.5 and was 11.9 the following day. She did not experience abdominal pain nausea vomiting or melena. She underwent followup EGD on 03/16/2013 and there was no evidence of bleed from polypectomy site. Patient was begun on usual diet which she tolerated well. Comprehensive chemistry panel was unremarkable with exception of glucose of 150 and a creatinine of 1.11. INR was 1.20 and PTT was 41. Her platelet count was normal. Biopsy result was available prior to discharge and was reviewed with patient her husband. It showed tubulovillous  adenoma with adenocarcinoma at the resection margin was clear. H. pylori serology was negative. I felt that this lesion was completely resected.  Followup EGD would be considered in 4-6 months when other smaller polyps could also be evaluated. I do not feel that she needs EUS at this time. Patient was advised to resume baby aspirin in 10 days. She will return for office visit in one month. Patient was informed that she could not have MRI until she's been documented to have passed Hemoclips.

## 2012-05-20 ENCOUNTER — Other Ambulatory Visit: Payer: Self-pay

## 2012-05-20 LAB — TYPE AND SCREEN
ABO/RH(D): O NEG
Unit division: 0

## 2012-05-23 ENCOUNTER — Encounter (INDEPENDENT_AMBULATORY_CARE_PROVIDER_SITE_OTHER): Payer: Self-pay | Admitting: *Deleted

## 2012-05-23 NOTE — Progress Notes (Signed)
UR Chart Review Completed  

## 2012-05-23 NOTE — Progress Notes (Signed)
Apt has been scheduled for 06/20/12 with Dorene Ar, NP.

## 2012-06-20 ENCOUNTER — Ambulatory Visit (INDEPENDENT_AMBULATORY_CARE_PROVIDER_SITE_OTHER): Payer: Medicare Other | Admitting: Internal Medicine

## 2012-06-20 ENCOUNTER — Encounter (INDEPENDENT_AMBULATORY_CARE_PROVIDER_SITE_OTHER): Payer: Self-pay | Admitting: Internal Medicine

## 2012-06-20 VITALS — BP 118/52 | HR 64 | Temp 97.6°F | Ht 65.0 in | Wt 115.5 lb

## 2012-06-20 DIAGNOSIS — C801 Malignant (primary) neoplasm, unspecified: Secondary | ICD-10-CM

## 2012-06-20 NOTE — Progress Notes (Signed)
Subjective:     Patient ID: [name], female   DOB: 08-Dec-1922, 79 y.o.   MRN: [id number]  HPIHere today for follow. Referred to our office in February for an abnormal CT scan by Dr. Annabell Howells Ct scan revealed 2.5cm enhancing and pedunculated polypoid mass in the anterior stomach. Endoscopy resection recommended. No renal mass or specific causefor hematuria is currently observed.  She underwent two EGD in February for multiple gastric polyps. 10mm submcuosal lesion biopsied was an adenocarcinoma within a tubulovillous adenoma. States she is doing good.No abdominal pain. Appetite is good. There has been no weight loss. She has a BM daily. Dark brown in color. No melena or bright red rectal bleeding. She is doing all her activities that she did before her procedures.  CBC    Component Value Date/Time   WBC 7.3 05/17/2012 0457   RBC 4.17 05/17/2012 0457   HGB 11.9* 05/17/2012 0457   HCT 35.7* 05/17/2012 0457   PLT 214 05/17/2012 0457   MCV 85.6 05/17/2012 0457   MCH 28.5 05/17/2012 0457   MCHC 33.3 05/17/2012 0457   RDW 14.4 05/17/2012 0457   2/14 H. Pylori negative.   05/16/2012 EGD: Small sliding heart hernia.  Multiple 3-4 mm gastric polyps at body and fundus.  10 mm flat submucosal lesion at antrum.  4 cm ulcerated polyp snared from gastric body.  Post-polypectomy bleed controlled with Hemoclip application and dilute appendectomy injection.   05/17/2012 EGD:  Small sliding hiatal hernia.   10 mm submucosal lesion at antrum possibly a leiomyoma. This lesion was left alone.  Biopsy: Adenocarcinoma within a tubulovillous adenoma.   No stigmata of bleeding from polypectomy site with 5 Hemoclips in place.  Multiple small hyperplastic-appearing gastric polyps.Review of Systems see hpi Current Outpatient Prescriptions  Medication Sig Dispense Refill  . amLODipine (NORVASC) 10 MG tablet Take 10 mg by mouth every morning.       Marland Kitchen aspirin 81 MG tablet Take 81 mg by mouth daily.      .  hydrochlorothiazide (HYDRODIURIL) 25 MG tablet Take 25 mg by mouth daily.      Marland Kitchen levothyroxine (SYNTHROID, LEVOTHROID) 50 MCG tablet Take 50 mcg by mouth every morning.       . metoprolol (LOPRESSOR) 50 MG tablet Take 50 mg by mouth 2 (two) times daily.      . pantoprazole (PROTONIX) 40 MG tablet Take 1 tablet (40 mg total) by mouth 2 (two) times daily before a meal.  60 tablet  1  . potassium chloride SA (K-DUR,KLOR-CON) 20 MEQ tablet Take 20 mEq by mouth daily.      . simvastatin (ZOCOR) 20 MG tablet Take 20 mg by mouth every evening.      . vitamin C (ASCORBIC ACID) 500 MG tablet Take 500 mg by mouth daily.       No current facility-administered medications for this visit.   Past Surgical History  Procedure Laterality Date  . Cataract extraction w/ intraocular lens  implant, bilateral    . Left ureteroscopy with fulguration of tumor/ removal and replacement left ureteral stent  06-24-2010    MALIGNANT NEOPLASM LEFT URETER  . Cysto/ left ureteroscopy/ bx ureteral tumor/ stent placement  05-19-2010  . Cysto/ bilateral retrogradce pyelogram/ left ureteroscopy with fulguration of tumor/ bladder bx and fulgeration of lesion's and tumor  05-18-2011  DR [name])    HX LEFT URETER TUMOR AND BLADDER TUMORS  . Cystoscopy/retrograde/ureteroscopy  10/14/2011    Procedure: CYSTOSCOPY/RETROGRADE/URETEROSCOPY;  Surgeon: Jonny Ruiz  Ranelle Oyster, MD;  Location: [facility];  Service: Urology;  Laterality: Left;      . Cystoscopy w/ retrogrades  01/18/2012    Procedure: CYSTOSCOPY WITH RETROGRADE PYELOGRAM;  Surgeon: Anner Crete, MD;  Location: [facility];  Service: Urology;  Laterality: Bilateral;  . Esophagogastroduodenoscopy N/A 05/17/2012    Procedure: ESOPHAGOGASTRODUODENOSCOPY (EGD);  Surgeon: Malissa Hippo, MD;  [facility];  Service: Endoscopy;  Laterality: N/A;  . Esophagogastroduodenoscopy N/A 05/16/2012    Procedure: ESOPHAGOGASTRODUODENOSCOPY (EGD);   Surgeon: Malissa Hippo, MD;  [facility];  Service: Endoscopy;  Laterality: N/A;  135-rescheduled to 7:30 Ann notified pt  . Esophageal biopsy N/A 05/16/2012    Procedure: BIOPSY;  Surgeon: Malissa Hippo, MD;  [facility];  Service: Endoscopy;  Laterality: N/A;  \ Allergies  Allergen Reactions  . Latex Rash        Objective:   Physical Exam  Filed Vitals:   06/20/12 1414  BP: 118/52  Pulse: 64  Temp: 97.6 F (36.4 C)  Height: 5\' 5"  (1.651 m)  Weight: 115 lb 8 oz (52.39 kg)     Alert and oriented. Skin warm and dry. Oral mucosa is moist.   . Sclera anicteric, conjunctivae is pink. Thyroid not enlarged. No cervical lymphadenopathy. Lungs clear. Heart regular rate and rhythm.  Abdomen is soft. Bowel sounds are positive. No hepatomegaly. No abdominal masses felt. No tenderness.  No edema to lower extremities. Stool brown and guaiac negative.     Assessment:     10 mm submucosal lesion at antrum possibly a leiomyoma. This lesion was left alone.  Biopsy: Adenocarcinoma within a tubulovillous adenoma. Biopsy: Adenocarcinoma arising out of a tubulovillous adenoma.  Dr. Karilyn Cota in with patient after exam.       Plan:   CBC today. Repeat EGD in 2 months for surveillance. Repeat EGD in February of 2015

## 2012-06-20 NOTE — Patient Instructions (Addendum)
CBC today. Repeat EGD in 2 months.

## 2012-07-03 ENCOUNTER — Telehealth (INDEPENDENT_AMBULATORY_CARE_PROVIDER_SITE_OTHER): Payer: Self-pay | Admitting: *Deleted

## 2012-07-03 NOTE — Telephone Encounter (Signed)
Pt called stating Terri and Dr. Karilyn Cota both seen her on 06/20/12. FYI - She had a bowel movement on 07/02/12 and saw blood in stool also urine.  Patient had asked to speak with Dr. Karilyn Cota. Advised her I would get this message to him.

## 2012-07-06 NOTE — Telephone Encounter (Signed)
I called the patient and she states that it was the medicine and not blood.

## 2012-07-31 ENCOUNTER — Encounter (INDEPENDENT_AMBULATORY_CARE_PROVIDER_SITE_OTHER): Payer: Self-pay | Admitting: *Deleted

## 2012-08-03 ENCOUNTER — Other Ambulatory Visit (HOSPITAL_COMMUNITY): Payer: Self-pay | Admitting: Internal Medicine

## 2012-08-03 ENCOUNTER — Telehealth (INDEPENDENT_AMBULATORY_CARE_PROVIDER_SITE_OTHER): Payer: Self-pay | Admitting: *Deleted

## 2012-08-03 DIAGNOSIS — Z139 Encounter for screening, unspecified: Secondary | ICD-10-CM

## 2012-08-03 NOTE — Telephone Encounter (Addendum)
You done an EGD on Ms [name] in Feb 2014 with removal of polyp (adenocarcinoma), she saw Terri in March 2014 and Terri had me put her on recall for 2 month repeat EGD -- does she need this soon

## 2012-08-04 NOTE — Telephone Encounter (Signed)
3 months from her last EGD

## 2012-08-07 ENCOUNTER — Other Ambulatory Visit (INDEPENDENT_AMBULATORY_CARE_PROVIDER_SITE_OTHER): Payer: Self-pay | Admitting: *Deleted

## 2012-08-07 ENCOUNTER — Encounter (INDEPENDENT_AMBULATORY_CARE_PROVIDER_SITE_OTHER): Payer: Self-pay | Admitting: *Deleted

## 2012-08-07 ENCOUNTER — Telehealth (INDEPENDENT_AMBULATORY_CARE_PROVIDER_SITE_OTHER): Payer: Self-pay | Admitting: *Deleted

## 2012-08-07 DIAGNOSIS — C169 Malignant neoplasm of stomach, unspecified: Secondary | ICD-10-CM

## 2012-08-07 NOTE — Telephone Encounter (Signed)
EGD sch'd 09/01/12 at 915, patient aware

## 2012-08-07 NOTE — Telephone Encounter (Signed)
agree

## 2012-08-07 NOTE — Telephone Encounter (Signed)
  Procedure: egd  Reason/Indication:  Adenocarcinoma of stomach  Has patient had this procedure before?  Yes, 05/2012  If so, when, by whom and where?    Is there a family history of colon cancer?    Who?  What age when diagnosed?    Is patient diabetic?   yes      Does patient have prosthetic heart valve?  no  Do you have a pacemaker?  no  Has patient ever had endocarditis? no  Has patient had joint replacement within last 12 months?  no  Is patient on Coumadin, Plavix and/or Aspirin? yes  Medications: see EPIC  Allergies: see EPIC  Medication Adjustment: asa 2 days before  Procedure date & time: 09/01/12 at 915

## 2012-08-11 ENCOUNTER — Ambulatory Visit (INDEPENDENT_AMBULATORY_CARE_PROVIDER_SITE_OTHER): Payer: Medicare Other | Admitting: Urology

## 2012-08-11 DIAGNOSIS — Z8559 Personal history of malignant neoplasm of other urinary tract organ: Secondary | ICD-10-CM

## 2012-08-11 DIAGNOSIS — Z8551 Personal history of malignant neoplasm of bladder: Secondary | ICD-10-CM

## 2012-08-15 ENCOUNTER — Encounter (HOSPITAL_COMMUNITY): Payer: Self-pay | Admitting: Pharmacy Technician

## 2012-08-25 ENCOUNTER — Ambulatory Visit (HOSPITAL_COMMUNITY)
Admission: RE | Admit: 2012-08-25 | Discharge: 2012-08-25 | Disposition: A | Discharge status: Home / Self Care | Payer: Medicare Other | Source: Ambulatory Visit | Attending: Internal Medicine | Admitting: Internal Medicine

## 2012-08-25 DIAGNOSIS — Z1231 Encounter for screening mammogram for malignant neoplasm of breast: Secondary | ICD-10-CM | POA: Insufficient documentation

## 2012-08-25 DIAGNOSIS — Z139 Encounter for screening, unspecified: Secondary | ICD-10-CM

## 2012-09-01 ENCOUNTER — Encounter (HOSPITAL_COMMUNITY)
Admission: RE | Disposition: A | Discharge status: Home / Self Care | Payer: Self-pay | Source: Ambulatory Visit | Attending: Internal Medicine

## 2012-09-01 ENCOUNTER — Ambulatory Visit (HOSPITAL_COMMUNITY)
Admission: RE | Admit: 2012-09-01 | Discharge: 2012-09-01 | Disposition: A | Discharge status: Home / Self Care | Payer: Medicare Other | Source: Ambulatory Visit | Attending: Internal Medicine | Admitting: Internal Medicine

## 2012-09-01 ENCOUNTER — Encounter (HOSPITAL_COMMUNITY): Payer: Self-pay | Admitting: *Deleted

## 2012-09-01 DIAGNOSIS — D3A092 Benign carcinoid tumor of the stomach: Secondary | ICD-10-CM | POA: Insufficient documentation

## 2012-09-01 DIAGNOSIS — Z09 Encounter for follow-up examination after completed treatment for conditions other than malignant neoplasm: Secondary | ICD-10-CM

## 2012-09-01 DIAGNOSIS — K449 Diaphragmatic hernia without obstruction or gangrene: Secondary | ICD-10-CM

## 2012-09-01 DIAGNOSIS — C169 Malignant neoplasm of stomach, unspecified: Secondary | ICD-10-CM | POA: Insufficient documentation

## 2012-09-01 DIAGNOSIS — K228 Other specified diseases of esophagus: Secondary | ICD-10-CM

## 2012-09-01 DIAGNOSIS — D131 Benign neoplasm of stomach: Secondary | ICD-10-CM

## 2012-09-01 HISTORY — PX: ESOPHAGOGASTRODUODENOSCOPY: SHX5428

## 2012-09-01 SURGERY — EGD (ESOPHAGOGASTRODUODENOSCOPY)
Anesthesia: Moderate Sedation

## 2012-09-01 MED ORDER — MIDAZOLAM HCL 5 MG/5ML IJ SOLN
INTRAMUSCULAR | Status: AC
  Filled 2012-09-01: qty 10

## 2012-09-01 MED ORDER — MEPERIDINE HCL 50 MG/ML IJ SOLN
INTRAMUSCULAR | Status: AC
  Filled 2012-09-01: qty 1

## 2012-09-01 MED ORDER — BUTAMBEN-TETRACAINE-BENZOCAINE 2-2-14 % EX AERO
INHALATION_SPRAY | CUTANEOUS | Status: DC | PRN
  Administered 2012-09-01: 2 via TOPICAL

## 2012-09-01 MED ORDER — STERILE WATER FOR IRRIGATION IR SOLN
Status: DC | PRN
  Administered 2012-09-01: 11:00:00

## 2012-09-01 MED ORDER — SODIUM CHLORIDE 0.9 % IV SOLN
INTRAVENOUS | Status: DC
  Administered 2012-09-01: 10:00:00 via INTRAVENOUS

## 2012-09-01 MED ORDER — MIDAZOLAM HCL 5 MG/5ML IJ SOLN
INTRAMUSCULAR | Status: DC | PRN
  Administered 2012-09-01: 1 mg via INTRAVENOUS
  Administered 2012-09-01: 2 mg via INTRAVENOUS

## 2012-09-01 MED ORDER — MEPERIDINE HCL 25 MG/ML IJ SOLN
INTRAMUSCULAR | Status: DC | PRN
  Administered 2012-09-01: 15 mg via INTRAVENOUS

## 2012-09-01 NOTE — H&P (Signed)
[name] is an 79 y.o. female.   Chief Complaint: Patient's here for EGD. HPI: Patient is-year-old Caucasian female who was found to have large gastric polyp on CT done for urologic problems. She had EGD in February 2014 with removal of 4 cm size polyp on a stalk. This polyp had adenocarcinoma the margins were free of tumor. She is returning for followup exam to make sure she does not local recurrence. She denies abdominal pain nausea vomiting anorexia or melena.  Past Medical History  Diagnosis Date  . Hypertension   . Hyperlipemia   . COPD (chronic obstructive pulmonary disease)   . Hypothyroidism   . Frequency of urination   . Incontinence of urine   . Diabetes mellitus type 2, diet-controlled     CBG CHECK 3 TIMES WKLY  . 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 History of transurethral destruction of bladder lesion S/P  BX'S AND FULGURATION'S OF TUMORS  . Microhematuria   . Nocturia     Past Surgical History  Procedure Laterality Date  . Cataract extraction w/ intraocular lens  implant, bilateral    . Left ureteroscopy with fulguration of tumor/ removal and replacement left ureteral stent  06-24-2010    MALIGNANT NEOPLASM LEFT URETER  . Cysto/ left ureteroscopy/ bx ureteral tumor/ stent placement  05-19-2010  . Cysto/ bilateral retrogradce pyelogram/ left ureteroscopy with fulguration of tumor/ bladder bx and fulgeration of lesion's and tumor  05-18-2011  DR [name])    HX LEFT URETER TUMOR AND BLADDER TUMORS  . Cystoscopy/retrograde/ureteroscopy  10/14/2011    Procedure: CYSTOSCOPY/RETROGRADE/URETEROSCOPY;  Surgeon: Anner Crete, MD;  Location: [facility];  Service: Urology;  Laterality: Left;      . Cystoscopy w/ retrogrades  01/18/2012    Procedure: CYSTOSCOPY WITH RETROGRADE PYELOGRAM;  Surgeon: Anner Crete, MD;  Location: [facility];  Service: Urology;   Laterality: Bilateral;  . Esophagogastroduodenoscopy N/A 05/17/2012    Procedure: ESOPHAGOGASTRODUODENOSCOPY (EGD);  Surgeon: Malissa Hippo, MD;  [facility];  Service: Endoscopy;  Laterality: N/A;  . Esophagogastroduodenoscopy N/A 05/16/2012    Procedure: ESOPHAGOGASTRODUODENOSCOPY (EGD);  Surgeon: Malissa Hippo, MD;  [facility];  Service: Endoscopy;  Laterality: N/A;  135-rescheduled to 7:30 Ann notified pt  . Esophageal biopsy N/A 05/16/2012    Procedure: BIOPSY;  Surgeon: Malissa Hippo, MD;  [facility];  Service: Endoscopy;  Laterality: N/A;    History reviewed. No pertinent family history. Social History:  reports that she has never smoked. She does not have any smokeless tobacco history on file. She reports that she does not drink alcohol or use illicit drugs.  Allergies:  Allergies  Allergen Reactions  . Latex Rash    Medications Prior to Admission  Medication Sig Dispense Refill  . amLODipine (NORVASC) 10 MG tablet Take 10 mg by mouth every morning.       Marland Kitchen aspirin 81 MG tablet Take 81 mg by mouth daily.      . hydrochlorothiazide (HYDRODIURIL) 25 MG tablet Take 25 mg by mouth daily.      Marland Kitchen levothyroxine (SYNTHROID, LEVOTHROID) 50 MCG tablet Take 50 mcg by mouth every morning.       . metoprolol (LOPRESSOR) 50 MG tablet Take 50 mg by mouth 2 (two) times daily.      . pantoprazole (PROTONIX) 40 MG tablet Take  1 tablet (40 mg total) by mouth 2 (two) times daily before a meal.  60 tablet  1  . potassium chloride SA (K-DUR,KLOR-CON) 20 MEQ tablet Take 20 mEq by mouth daily.      . simvastatin (ZOCOR) 20 MG tablet Take 20 mg by mouth every evening.      . vitamin C (ASCORBIC ACID) 500 MG tablet Take 500 mg by mouth daily.        No results found for this or any previous visit (from the past 48 hour(s)). No results found.  ROS  Blood pressure 141/56, pulse 84, temperature 97.6 F (36.4 C), temperature source Oral, resp. rate 19, height  5\' 2"  (1.575 m), weight 117 lb (53.071 kg), SpO2 97.00%. Physical Exam  Constitutional: She appears well-developed and well-nourished.  HENT:  Mouth/Throat: Oropharynx is clear and moist.  Eyes: Conjunctivae are normal. No scleral icterus.  Neck: No thyromegaly present.  Cardiovascular: Normal rate and regular rhythm.   Murmur: faint systolic ejection murmur at LLSB. Respiratory: Effort normal and breath sounds normal.  GI: Soft. She exhibits no distension and no mass. There is no tenderness.  Musculoskeletal: She exhibits no edema.  Lymphadenopathy:    She has no cervical adenopathy.  Neurological: She is alert.  Skin: Skin is warm and dry.     Assessment/Plan History of large gastric polyp with invasive adenocarcinoma. Last EGD was in February 2014. Surveillance EGD.  Kurstin Dimarzo U 09/01/2012, 11:12 AM

## 2012-09-01 NOTE — Op Note (Signed)
EGD PROCEDURE REPORT  PATIENT:  [name]  MR#:  [id number] Birthdate:  Aug 11, 1922, 79 y.o., female Endoscopist:  Dr. Malissa Hippo, MD Referred By:  Dr. Carylon Perches, MD  Procedure Date: 09/01/2012  Procedure:   EGD  Indications:  Patient is 79 year old Caucasian female with large pedunculated polyp removed from her stomach on 05/16/2012. This polyp had invasive carcinoma with polypectomy site was clean. She had immediate post-polypectomy bleed controlled with Hemoclip application and appendectomy injection. She is on returning for followup exam to make sure she does not have local recurrence.            Informed Consent:  The risks, benefits, alternatives & imponderables which include, but are not limited to, bleeding, infection, perforation, drug reaction and potential missed lesion have been reviewed.  The potential for biopsy, lesion removal, esophageal dilation, etc. have also been discussed.  Questions have been answered.  All parties agreeable.  Please see history & physical in medical record for more information.  Medications:  Demerol 15 mg IV Versed 3 mg IV Cetacaine spray topically for oropharyngeal anesthesia  Description of procedure:  The endoscope was introduced through the mouth and advanced to the second portion of the duodenum without difficulty or limitations. The mucosal surfaces were surveyed very carefully during advancement of the scope and upon withdrawal.  Findings:  Esophagus:  Mucosa of the esophagus was normal. GE junction was unremarkable. GEJ:  40 cm Stomach:  Stomach was empty and distended very well with insufflation. Folds in the proximal stomach are normal. Multiple small 3-5 mm polyps noted at gastric body some with surface erosion. 1 Hemoclip [place] at polypectomy site which appeared well-healed without local recurrence . 8 mm submucosal lesion noted at antrum unchanged from previous exam. Pyloric channel was patent. And as fundus and cardia was examined  by retroflex the scope and were normal. Duodenum:  Normal bulbar and post bulbar mucosa.  Therapeutic/Diagnostic Maneuvers Performed:   Biopsy was taken for small polyps at gastric body and submitted in one container.  Complications:  None  Impression: No evidence of local recurrence at polypectomy site. Single clip remains at polypectomy site. Multiple small polyps the gastric body 4 of which were biopsied for histology. Small submucosal lesion in the antrum unchanged from previous exam of 10 weeks ago.  Recommendations:  Standard instructions given. No aspirin for 3 days. I will be contacting patient with results of biopsy and further recommendations  Corianna Avallone U  09/01/2012  11:39 AM  CC: Dr. Carylon Perches, MD & Dr. Bonnetta Barry ref. provider found

## 2012-09-04 ENCOUNTER — Encounter (HOSPITAL_COMMUNITY): Payer: Self-pay | Admitting: Internal Medicine

## 2012-09-11 ENCOUNTER — Telehealth (INDEPENDENT_AMBULATORY_CARE_PROVIDER_SITE_OTHER): Payer: Self-pay | Admitting: *Deleted

## 2012-09-11 NOTE — Telephone Encounter (Signed)
Patient was returning Dr. Patty Sermons call. Her return phone number is [phone].

## 2012-09-11 NOTE — Telephone Encounter (Signed)
Dr.Rehman made aware , and states that he has been trying to reach her. He plans to call her again

## 2012-09-12 ENCOUNTER — Encounter (INDEPENDENT_AMBULATORY_CARE_PROVIDER_SITE_OTHER): Payer: Self-pay | Admitting: *Deleted

## 2012-10-25 ENCOUNTER — Other Ambulatory Visit (INDEPENDENT_AMBULATORY_CARE_PROVIDER_SITE_OTHER): Payer: Self-pay | Admitting: Internal Medicine

## 2012-11-08 ENCOUNTER — Other Ambulatory Visit: Payer: Self-pay

## 2013-02-08 ENCOUNTER — Other Ambulatory Visit: Payer: Self-pay

## 2013-02-16 ENCOUNTER — Ambulatory Visit (INDEPENDENT_AMBULATORY_CARE_PROVIDER_SITE_OTHER): Payer: Medicare Other | Admitting: Urology

## 2013-02-16 DIAGNOSIS — Z8559 Personal history of malignant neoplasm of other urinary tract organ: Secondary | ICD-10-CM

## 2013-02-16 DIAGNOSIS — R310 Gross hematuria: Secondary | ICD-10-CM

## 2013-02-16 DIAGNOSIS — Z8551 Personal history of malignant neoplasm of bladder: Secondary | ICD-10-CM

## 2013-02-19 ENCOUNTER — Other Ambulatory Visit: Payer: Self-pay | Admitting: Urology

## 2013-03-09 ENCOUNTER — Encounter (HOSPITAL_BASED_OUTPATIENT_CLINIC_OR_DEPARTMENT_OTHER): Payer: Self-pay | Admitting: *Deleted

## 2013-03-09 NOTE — Progress Notes (Signed)
NPO AFTER MN. ARRIVE AT 0600. NEEDS ISTAT , CXR AND EKG. WILL TAKE NORVASC, LOPRESSOR, PROTONIX, AND SYNTHROID AM DOS W/ SIPS OF WATER.

## 2013-03-12 NOTE — H&P (Signed)
1. Gross hematuria (599.71)  2. 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 Hyperplastic polyps of stomach (211.1)  5. Microscopic hematuria (599.72)  6. Pyuria (791.9)  History of Present Illness   [name] returns today in f/u.  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  She was treated with stenting and BCG.   She has had subsequent biopsies and ureteroscopy that was negative.  Her last upper tract imaging was in 2/14 as was negative for upper tract disease.  She has had some post treatment changes on the bladder wall and persistant microhematuria, but over the last 2 weeks she has had intermittant gross hematuria.  She has no clots.   She has had no flank pain but a dull ache at times.   Past Medical History  1. 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 History of diabetes mellitus (V12.29)  3. History of hypercholesterolemia (V12.29)  4. History of hypertension (V12.59)  5. History of hypothyroidism (V12.29)  6. 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 History of Microscopic hematuria (599.72)  8. History of Neoplasm of uncertain behavior of ureter, unspecified laterality (236.91)  9. History of Pyuria (791.9)  Surgical History  1. History of Cystoscopy With Fulguration Small Lesion (5-35mm)  2. History of Cystoscopy With Insertion Of Ureteral Stent Left  3. History of Cystoscopy With Insertion Of Ureteral Stent Left  4. History of Cystoscopy With Insertion Of Ureteral Stent Right  5. History of Cystoscopy With Resection Of Tumor  6. History of Cystoscopy With Resection Of Tumor  7. History of Cystoscopy With Ureteral Catheterization  8. History of Cystoscopy With Ureteroscopy For Biopsy Left  9. History of Cystoscopy With Ureteroscopy Left  10. History of Cystoscopy With Ureteroscopy Left  11. History of No Surgical Problems  Current Meds  1.  AmLODIPine Besylate 10 MG Oral Tablet;  Therapy: (Recorded:27Jan2012) to Recorded  2. Aspirin Low Dose 81 MG Oral Tablet;  Therapy: (Recorded:27Jan2012) to Recorded  3. Caltrate 600+D TABS;  Therapy: (Recorded:27Jan2012) to Recorded  4. Clobetasol Propionate 0.05 % External Cream;  Therapy: 19Nov2012 to Recorded  5. Hydrochlorothiazide 25 MG Oral Tablet;  Therapy: (Recorded:27Jan2012) to Recorded  6. Klor-Con 20 MEQ Oral Packet;  Therapy: (Recorded:27Jan2012) to Recorded  7. Levothyroxine Sodium 50 MCG Oral Tablet;  Therapy: (Recorded:27Jan2012) to Recorded  8. Metoprolol Tartrate 50 MG Oral Tablet;  Therapy: 25May2012 to Recorded  9. Potassium Chloride Crys ER 20 MEQ Oral Tablet Extended Release;  Therapy: 01May2012 to Recorded  10. Simvastatin 20 MG Oral Tablet;   Therapy: (Recorded:27Jan2012) to Recorded  Allergies  1. No Known Drug Allergies  Family History  1. Family history of Family Health Status Number Of Children  Social History  1. Denied: History of Alcohol Use  2. Caffeine Use   1 coffee per day  3. Marital History - Currently Married  4. Never A Smoker  5. Retired From Work   Past and social history reviewed and updated.   Review of Systems Genitourinary, constitutional, skin, eye, otolaryngeal, hematologic/lymphatic, cardiovascular, pulmonary, endocrine, musculoskeletal, gastrointestinal, neurological and psychiatric system(s) were reviewed and pertinent findings if present are noted.  Genitourinary: hematuria.  Gastrointestinal: no flank pain.    Vitals Vital Signs [Data Includes: Last 1 Day]  Recorded: 14Nov2014 10:32AM  Blood Pressure: 145 / 60 Temperature: 97.4 F Heart Rate: 67  Physical  Exam Constitutional: Well nourished and well developed . No acute distress.  Pulmonary: No respiratory distress and normal respiratory rhythm and effort.  Cardiovascular: Heart rate and rhythm are normal . No peripheral edema.  Abdomen: The abdomen is soft and  nontender. No masses are palpated. No CVA tenderness. No hernias are palpable. No hepatosplenomegaly noted.    Results/Data Urine [Data Includes: Last 1 Day]   14Nov2014  COLOR RED   APPEARANCE CLOUDY   SPECIFIC GRAVITY 1.015   pH 6.0   GLUCOSE NEG mg/dL  BILIRUBIN NEG   KETONE NEG mg/dL  BLOOD LARGE   PROTEIN 30 mg/dL  UROBILINOGEN 1 mg/dL  NITRITE NEG   LEUKOCYTE ESTERASE NEG   SQUAMOUS EPITHELIAL/HPF FEW   WBC 0-2 WBC/hpf  RBC TNTC RBC/hpf  BACTERIA NONE SEEN   CRYSTALS NONE SEEN   CASTS NONE SEEN    Procedure  Procedure: Cystoscopy   Indication: Hematuria. History of Urothelial Carcinoma.  Informed Consent: Risks, benefits, and potential adverse events were discussed and informed consent was obtained from the patient.  Prep: The patient was prepped with betadine.  Procedure Note:  Urethral meatus:. No abnormalities.  Anterior urethra: No abnormalities.  Bladder: Visulization was clear. The ureteral orifices were in the normal anatomic position bilaterally. The right ureteral orifice had clear efflux of urine. The left ureteral orifice had bloody efflux. A systematic survey of the bladder demonstrated no bladder tumors or stones. Examination of the bladder demonstrated clot within the bladder (old blood layered in the base of the bladder. L > R) erythematous mucosa (mild stable erythema on the posterior wall). The patient tolerated the procedure well.  Complications: None.    Assessment  1. Gross hematuria (599.71)  2. 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   Plan  I am going to set her up for cystoscopy with RTG's and ureteroscopy with biopsy and laser treatment.   Risks reviewed.   Discussion/Summary  CC: Dr. Carylon Perches.

## 2013-03-12 NOTE — Anesthesia Preprocedure Evaluation (Addendum)
Anesthesia Evaluation  Patient identified by MRN, date of birth, ID band Patient awake    Reviewed: Allergy & Precautions, H&P , NPO status , Patient's Chart, lab work & pertinent test results, reviewed documented beta blocker date and time , Unable to perform ROS - Chart review only  Airway Mallampati: II TM Distance: >3 FB Neck ROM: full    Dental no notable dental hx. (+) Teeth Intact and Dental Advisory Given   Pulmonary COPD breath sounds clear to auscultation  Pulmonary exam normal       Cardiovascular Exercise Tolerance: Good hypertension, Pt. on home beta blockers and Pt. on medications Rhythm:regular Rate:Normal  Denies cardiac symptoms or prior history of CAD   Neuro/Psych negative neurological ROS  negative psych ROS   GI/Hepatic negative GI ROS, Neg liver ROS,   Endo/Other  diabetes (Diet control), Well Controlled, Type 2Hypothyroidism   Renal/GU Renal disease  negative genitourinary   Musculoskeletal   Abdominal   Peds  Hematology negative hematology ROS (+)   Anesthesia Other Findings   Reproductive/Obstetrics negative OB ROS                          Anesthesia Physical Anesthesia Plan  ASA: III  Anesthesia Plan: General   Post-op Pain Management:    Induction: Intravenous  Airway Management Planned: LMA  Additional Equipment:   Intra-op Plan:   Post-operative Plan: Extubation in OR  Informed Consent: I have reviewed the patients History and Physical, chart, labs and discussed the procedure including the risks, benefits and alternatives for the proposed anesthesia with the patient or authorized representative who has indicated his/her understanding and acceptance.   Dental advisory given  Plan Discussed with: CRNA  Anesthesia Plan Comments:         Anesthesia Quick Evaluation

## 2013-03-13 ENCOUNTER — Ambulatory Visit (HOSPITAL_BASED_OUTPATIENT_CLINIC_OR_DEPARTMENT_OTHER): Payer: Medicare Other | Admitting: Anesthesiology

## 2013-03-13 ENCOUNTER — Encounter (HOSPITAL_BASED_OUTPATIENT_CLINIC_OR_DEPARTMENT_OTHER): Payer: Self-pay | Admitting: *Deleted

## 2013-03-13 ENCOUNTER — Encounter (HOSPITAL_BASED_OUTPATIENT_CLINIC_OR_DEPARTMENT_OTHER)
Admission: RE | Disposition: A | Discharge status: Home / Self Care | Payer: Self-pay | Source: Ambulatory Visit | Attending: Urology

## 2013-03-13 ENCOUNTER — Ambulatory Visit (HOSPITAL_COMMUNITY): Payer: Medicare Other

## 2013-03-13 ENCOUNTER — Other Ambulatory Visit: Payer: Self-pay

## 2013-03-13 ENCOUNTER — Encounter (HOSPITAL_BASED_OUTPATIENT_CLINIC_OR_DEPARTMENT_OTHER): Payer: Medicare Other | Admitting: Anesthesiology

## 2013-03-13 ENCOUNTER — Ambulatory Visit (HOSPITAL_BASED_OUTPATIENT_CLINIC_OR_DEPARTMENT_OTHER)
Admission: RE | Admit: 2013-03-13 | Discharge: 2013-03-13 | Disposition: A | Discharge status: Home / Self Care | Payer: Medicare Other | Source: Ambulatory Visit | Attending: Urology | Admitting: Urology

## 2013-03-13 DIAGNOSIS — D4959 Neoplasm of unspecified behavior of other genitourinary organ: Secondary | ICD-10-CM | POA: Insufficient documentation

## 2013-03-13 DIAGNOSIS — E119 Type 2 diabetes mellitus without complications: Secondary | ICD-10-CM | POA: Insufficient documentation

## 2013-03-13 DIAGNOSIS — J4489 Other specified chronic obstructive pulmonary disease: Secondary | ICD-10-CM | POA: Insufficient documentation

## 2013-03-13 DIAGNOSIS — Z8559 Personal history of malignant neoplasm of other urinary tract organ: Secondary | ICD-10-CM | POA: Insufficient documentation

## 2013-03-13 DIAGNOSIS — Z7982 Long term (current) use of aspirin: Secondary | ICD-10-CM | POA: Insufficient documentation

## 2013-03-13 DIAGNOSIS — C649 Malignant neoplasm of unspecified kidney, except renal pelvis: Secondary | ICD-10-CM | POA: Insufficient documentation

## 2013-03-13 DIAGNOSIS — J449 Chronic obstructive pulmonary disease, unspecified: Secondary | ICD-10-CM | POA: Insufficient documentation

## 2013-03-13 DIAGNOSIS — Z79899 Other long term (current) drug therapy: Secondary | ICD-10-CM | POA: Insufficient documentation

## 2013-03-13 DIAGNOSIS — I10 Essential (primary) hypertension: Secondary | ICD-10-CM | POA: Insufficient documentation

## 2013-03-13 DIAGNOSIS — E039 Hypothyroidism, unspecified: Secondary | ICD-10-CM | POA: Insufficient documentation

## 2013-03-13 DIAGNOSIS — E7800 Pure hypercholesterolemia, unspecified: Secondary | ICD-10-CM | POA: Insufficient documentation

## 2013-03-13 DIAGNOSIS — Z8551 Personal history of malignant neoplasm of bladder: Secondary | ICD-10-CM | POA: Insufficient documentation

## 2013-03-13 HISTORY — DX: Personal history of other diseases of the digestive system: Z87.19

## 2013-03-13 HISTORY — PX: CYSTOSCOPY/RETROGRADE/URETEROSCOPY: SHX5316

## 2013-03-13 HISTORY — DX: Personal history of malignant neoplasm of bladder: Z85.51

## 2013-03-13 HISTORY — PX: HOLMIUM LASER APPLICATION: SHX5852

## 2013-03-13 HISTORY — DX: Gross hematuria: R31.0

## 2013-03-13 LAB — POCT I-STAT, CHEM 8
BUN: 18 mg/dL (ref 6–23)
Calcium, Ion: 1.27 mmol/L (ref 1.13–1.30)
Chloride: 100 mEq/L (ref 96–112)
Glucose, Bld: 106 mg/dL — ABNORMAL HIGH (ref 70–99)
TCO2: 29 mmol/L (ref 0–100)

## 2013-03-13 LAB — GLUCOSE, CAPILLARY: Glucose-Capillary: 137 mg/dL — ABNORMAL HIGH (ref 70–99)

## 2013-03-13 SURGERY — CYSTOSCOPY/RETROGRADE/URETEROSCOPY
Anesthesia: General | Site: Ureter | Laterality: Bilateral

## 2013-03-13 MED ORDER — IOHEXOL 350 MG/ML SOLN
INTRAVENOUS | Status: DC | PRN
  Administered 2013-03-13: 10 mL

## 2013-03-13 MED ORDER — ONDANSETRON HCL 4 MG/2ML IJ SOLN
4.0000 mg | Freq: Four times a day (QID) | INTRAMUSCULAR | Status: DC | PRN
  Filled 2013-03-13: qty 2

## 2013-03-13 MED ORDER — ACETAMINOPHEN 650 MG RE SUPP
650.0000 mg | RECTAL | Status: DC | PRN
  Filled 2013-03-13: qty 1

## 2013-03-13 MED ORDER — ONDANSETRON HCL 4 MG/2ML IJ SOLN
INTRAMUSCULAR | Status: DC | PRN
  Administered 2013-03-13: 4 mg via INTRAVENOUS

## 2013-03-13 MED ORDER — DEXAMETHASONE SODIUM PHOSPHATE 4 MG/ML IJ SOLN
INTRAMUSCULAR | Status: DC | PRN
  Administered 2013-03-13: 4 mg via INTRAVENOUS

## 2013-03-13 MED ORDER — CIPROFLOXACIN IN D5W 400 MG/200ML IV SOLN
400.0000 mg | INTRAVENOUS | Status: AC
  Administered 2013-03-13: 400 mg via INTRAVENOUS
  Filled 2013-03-13: qty 200

## 2013-03-13 MED ORDER — SODIUM CHLORIDE 0.9 % IJ SOLN
3.0000 mL | INTRAMUSCULAR | Status: DC | PRN
  Filled 2013-03-13: qty 3

## 2013-03-13 MED ORDER — OXYCODONE HCL 5 MG PO TABS
5.0000 mg | ORAL_TABLET | ORAL | Status: DC | PRN
  Filled 2013-03-13: qty 2

## 2013-03-13 MED ORDER — LACTATED RINGERS IV SOLN
INTRAVENOUS | Status: DC
  Filled 2013-03-13: qty 1000

## 2013-03-13 MED ORDER — LIDOCAINE HCL (CARDIAC) 20 MG/ML IV SOLN
INTRAVENOUS | Status: DC | PRN
  Administered 2013-03-13: 50 mg via INTRAVENOUS

## 2013-03-13 MED ORDER — SODIUM CHLORIDE 0.9 % IR SOLN
Status: DC | PRN
  Administered 2013-03-13: 1000 mL

## 2013-03-13 MED ORDER — FENTANYL CITRATE 0.05 MG/ML IJ SOLN
INTRAMUSCULAR | Status: AC
Start: 2013-03-13 — End: 2013-03-13
  Filled 2013-03-13: qty 4

## 2013-03-13 MED ORDER — PROPOFOL 10 MG/ML IV BOLUS
INTRAVENOUS | Status: DC | PRN
  Administered 2013-03-13: 120 mg via INTRAVENOUS

## 2013-03-13 MED ORDER — FENTANYL CITRATE 0.05 MG/ML IJ SOLN
25.0000 ug | INTRAMUSCULAR | Status: DC | PRN
  Filled 2013-03-13: qty 1

## 2013-03-13 MED ORDER — BELLADONNA ALKALOIDS-OPIUM 16.2-60 MG RE SUPP
RECTAL | Status: AC
  Filled 2013-03-13: qty 1

## 2013-03-13 MED ORDER — CIPROFLOXACIN IN D5W 400 MG/200ML IV SOLN
INTRAVENOUS | Status: AC
  Filled 2013-03-13: qty 200

## 2013-03-13 MED ORDER — HYDROCODONE-ACETAMINOPHEN 5-325 MG PO TABS: 1.0000 | ORAL_TABLET | Freq: Four times a day (QID) | ORAL | Status: AC | PRN

## 2013-03-13 MED ORDER — CIPROFLOXACIN HCL 250 MG PO TABS: 250.0000 mg | ORAL_TABLET | Freq: Two times a day (BID) | ORAL | Status: AC

## 2013-03-13 MED ORDER — LACTATED RINGERS IV SOLN
INTRAVENOUS | Status: DC
  Administered 2013-03-13 (×2): via INTRAVENOUS
  Filled 2013-03-13: qty 1000

## 2013-03-13 MED ORDER — EPHEDRINE SULFATE 50 MG/ML IJ SOLN
INTRAMUSCULAR | Status: DC | PRN
  Administered 2013-03-13: 10 mg via INTRAVENOUS

## 2013-03-13 MED ORDER — FENTANYL CITRATE 0.05 MG/ML IJ SOLN
INTRAMUSCULAR | Status: DC | PRN
  Administered 2013-03-13: 12.5 ug via INTRAVENOUS
  Administered 2013-03-13: 50 ug via INTRAVENOUS
  Administered 2013-03-13: 12.5 ug via INTRAVENOUS

## 2013-03-13 MED ORDER — ACETAMINOPHEN 325 MG PO TABS
650.0000 mg | ORAL_TABLET | ORAL | Status: DC | PRN
  Filled 2013-03-13: qty 2

## 2013-03-13 MED ORDER — SODIUM CHLORIDE 0.9 % IV SOLN
250.0000 mL | INTRAVENOUS | Status: DC | PRN
  Filled 2013-03-13: qty 250

## 2013-03-13 MED ORDER — SODIUM CHLORIDE 0.9 % IJ SOLN
3.0000 mL | Freq: Two times a day (BID) | INTRAMUSCULAR | Status: DC
  Filled 2013-03-13: qty 3

## 2013-03-13 SURGICAL SUPPLY — 38 items
BAG DRAIN URO-CYSTO SKYTR STRL (DRAIN) ×3 IMPLANT
BAG DRN UROCATH (DRAIN) ×2
BASKET LASER NITINOL 1.9FR (BASKET) IMPLANT
BASKET ZERO TIP NITINOL 2.4FR (BASKET) IMPLANT
BRUSH URET BIOPSY 3F (UROLOGICAL SUPPLIES) IMPLANT
BSKT STON RTRVL 120 1.9FR (BASKET)
BSKT STON RTRVL ZERO TP 2.4FR (BASKET)
CANISTER SUCT LVC 12 LTR MEDI- (MISCELLANEOUS) ×1 IMPLANT
CATH URET 5FR 28IN CONE TIP (BALLOONS)
CATH URET 5FR 28IN OPEN ENDED (CATHETERS) ×1 IMPLANT
CATH URET 5FR 70CM CONE TIP (BALLOONS) IMPLANT
CLOTH BEACON ORANGE TIMEOUT ST (SAFETY) ×3 IMPLANT
DRAPE CAMERA CLOSED 9X96 (DRAPES) ×3 IMPLANT
ELECT REM PT RETURN 9FT ADLT (ELECTROSURGICAL)
ELECTRODE REM PT RTRN 9FT ADLT (ELECTROSURGICAL) IMPLANT
FIBER LASER FLEXIVA 1000 (UROLOGICAL SUPPLIES) IMPLANT
FIBER LASER FLEXIVA 200 (UROLOGICAL SUPPLIES) ×1 IMPLANT
FIBER LASER FLEXIVA 365 (UROLOGICAL SUPPLIES) IMPLANT
FIBER LASER FLEXIVA 550 (UROLOGICAL SUPPLIES) IMPLANT
GLOVE BIOGEL PI IND STRL 6.5 (GLOVE) IMPLANT
GLOVE BIOGEL PI INDICATOR 6.5 (GLOVE) ×1
GLOVE INDICATOR 6.5 STRL GRN (GLOVE) ×2 IMPLANT
GLOVE SURG SS PI 8.0 STRL IVOR (GLOVE) ×3 IMPLANT
GOWN PREVENTION PLUS LG XLONG (DISPOSABLE) IMPLANT
GOWN STRL NON-REIN LRG LVL3 (GOWN DISPOSABLE) ×1 IMPLANT
GOWN STRL REIN XL XLG (GOWN DISPOSABLE) ×3 IMPLANT
GUIDEWIRE 0.038 PTFE COATED (WIRE) IMPLANT
GUIDEWIRE ANG ZIPWIRE 038X150 (WIRE) IMPLANT
GUIDEWIRE STR DUAL SENSOR (WIRE) ×4 IMPLANT
IV NS IRRIG 3000ML ARTHROMATIC (IV SOLUTION) ×2 IMPLANT
KIT BALLIN UROMAX 15FX10 (LABEL) IMPLANT
KIT BALLN UROMAX 15FX4 (MISCELLANEOUS) IMPLANT
KIT BALLN UROMAX 26 75X4 (MISCELLANEOUS)
PACK CYSTOSCOPY (CUSTOM PROCEDURE TRAY) ×3 IMPLANT
SET HIGH PRES BAL DIL (LABEL)
SHEATH ACCESS URETERAL 38CM (SHEATH) ×1 IMPLANT
SHEATH ACCESS URETERAL 54CM (SHEATH) IMPLANT
STENT URET 6FRX24 CONTOUR (STENTS) ×1 IMPLANT

## 2013-03-13 NOTE — Transfer of Care (Signed)
Immediate Anesthesia Transfer of Care Note  Patient: [name]  Procedure(s) Performed: Procedure(s) (LRB): CYSTOSCOPY/BILATERAL RETROGRADE/LEFT URETEROSCOPY WITH BIOPSY AND FULGERATION (Bilateral) HOLMIUM LASER APPLICATION (Bilateral)  Patient Location: PACU  Anesthesia Type: General  Level of Consciousness: awake, oriented, sedated and patient cooperative  Airway & Oxygen Therapy: Patient Spontanous Breathing and Patient connected to face mask oxygen  Post-op Assessment: Report given to PACU RN and Post -op Vital signs reviewed and stable  Post vital signs: Reviewed and stable  Complications: No apparent anesthesia complications

## 2013-03-13 NOTE — Brief Op Note (Signed)
03/13/2013  8:39 AM  PATIENT:  [name]  79 y.o. female  PRE-OPERATIVE DIAGNOSIS:  gross hematuria with probably left ureteral tumor  POST-OPERATIVE DIAGNOSIS:  gross hematuria with left upper pole urothelial tumor.  PROCEDURE:  Procedure(s): CYSTOSCOPY/BILATERAL RETROGRADE/LEFT URETEROSCOPY WITH BIOPSY AND FULGERATION (Bilateral) HOLMIUM LASER APPLICATION (Bilateral)  SURGEON:  Surgeon(s) and Role:    * Bjorn Pippin, MD - Primary  PHYSICIAN ASSISTANT:   ASSISTANTS: none   ANESTHESIA:   general  EBL:  Total I/O In: 800 [I.V.:800] Out: -   BLOOD ADMINISTERED:none  DRAINS: 6x24 left JJ stent   LOCAL MEDICATIONS USED:  NONE  SPECIMEN:  Source of Specimen:  cytology and biopsies from left kidney  DISPOSITION OF SPECIMEN:  PATHOLOGY  COUNTS:  YES  TOURNIQUET:  * No tourniquets in log *  DICTATION: .Other Dictation: Dictation Number [id number]  PLAN OF CARE: Discharge to home after PACU  PATIENT DISPOSITION:  PACU - hemodynamically stable.   Delay start of Pharmacological VTE agent (>24hrs) due to surgical blood loss or risk of bleeding: not applicable

## 2013-03-13 NOTE — Interval H&P Note (Signed)
History and Physical Interval Note:  03/13/2013 7:25 AM  [name]  has presented today for surgery, with the diagnosis of gross hematuria with probably left ureteral tumor  The various methods of treatment have been discussed with the patient and family. After consideration of risks, benefits and other options for treatment, the patient has consented to  Procedure(s): CYSTOSCOPY/BILATERAL RETROGRADE/LEFT URETEROSCOPY WITH BIOPSY AND FULGERATION (Bilateral) HOLMIUM LASER APPLICATION (Bilateral) as a surgical intervention .  [name] history has been reviewed, patient examined, no change in status, stable for surgery.  I have reviewed the patient's chart and labs.  Questions were answered to the patient's satisfaction.     Liberti Appleton J

## 2013-03-13 NOTE — Anesthesia Postprocedure Evaluation (Signed)
Anesthesia Post Note  Patient: [name]  Procedure(s) Performed: Procedure(s) (LRB): CYSTOSCOPY/BILATERAL RETROGRADE/LEFT URETEROSCOPY WITH BIOPSY AND FULGERATION (Bilateral) HOLMIUM LASER APPLICATION (Bilateral)  Anesthesia type: General  Patient location: PACU  Post pain: Pain level controlled  Post assessment: Post-op Vital signs reviewed  Last Vitals:  Filed Vitals:   03/13/13 0915  BP: 120/54  Pulse: 73  Temp:   Resp: 10    Post vital signs: Reviewed  Level of consciousness: sedated  Complications: No apparent anesthesia complications

## 2013-03-13 NOTE — Anesthesia Procedure Notes (Signed)
Procedure Name: LMA Insertion Date/Time: 03/13/2013 7:39 AM Performed by: Renella Cunas D Pre-anesthesia Checklist: Patient identified, Emergency Drugs available, Suction available and Patient being monitored Patient Re-evaluated:Patient Re-evaluated prior to inductionOxygen Delivery Method: Circle System Utilized Preoxygenation: Pre-oxygenation with 100% oxygen Intubation Type: IV induction Ventilation: Mask ventilation without difficulty LMA: LMA inserted LMA Size: 4.0 Number of attempts: 1 Airway Equipment and Method: bite block Placement Confirmation: positive ETCO2 Tube secured with: Tape Dental Injury: Teeth and Oropharynx as per pre-operative assessment

## 2013-03-14 ENCOUNTER — Encounter (HOSPITAL_BASED_OUTPATIENT_CLINIC_OR_DEPARTMENT_OTHER): Payer: Self-pay | Admitting: Urology

## 2013-03-15 NOTE — Op Note (Signed)
[name], DEJONGE                  ACCOUNT NO.:  [id number]  MEDICAL RECORD NO.:  [id number]  LOCATION:                                 FACILITY:  PHYSICIAN:  Excell Seltzer. [name], M.D.    DATE OF BIRTH:  [birth year]  DATE OF PROCEDURE:  03/13/2013 DATE OF DISCHARGE:  03/13/2013                              OPERATIVE REPORT   PROCEDURES: 1. Cystoscopy with bilateral retrograde pyelograms with     interpretation. 2. Left ureteroscopy with biopsy and fulguration. 3. Insertion of left double-J stent.  PREOPERATIVE DIAGNOSIS:  Gross hematuria with a history of left ureteral tumors.  POSTOPERATIVE DIAGNOSIS:  Left upper pole caliceal urothelial carcinoma.  SURGEON:  Excell Seltzer. [name], M.D.  ANESTHESIA:  General.  SPECIMENS:  Cytologies from the left renal pelvis and biopsies from the left upper pole calyx.  DRAINS:  6-French x 24-cm left double-J stent.  BLOOD LOSS:  Minimal.  COMPLICATIONS:  None.  INDICATIONS:  [name] is an 79 year old white female with prior history of left ureteral and bladder low-grade nonmuscle invasive urothelial carcinoma.  She had previously been treated with BCG and resection of fulguration.  She returned for followup evaluation with gross hematuria that was localized to the left ureteral orifice.  It was felt that cystoscopy with retrograde pyelography and ureteroscopy was indicated.  FINDINGS OF PROCEDURE:  She was taken to the operating room where she was given Cipro.  A general anesthetic was induced.  She was placed in lithotomy position and fitted with PAS hose.  Her perineum and genitalia were prepped with Betadine solution.  She was draped in usual sterile fashion.  Cystoscopy was performed using the 22-French scope and 12-degree lens. Examination revealed a normal urethra.  The bladder wall was smooth and pale without tumor, stones, or inflammation.  The ureteral orifices were unremarkable.  Right ureteral orifice was cannulated with a  5-French open-end catheter and contrast was instilled.  This revealed a normal ureter and intrarenal collecting system.  The left ureteral orifice was then cannulated with 5-French open-end catheter and contrast was instilled.  The left ureter was unremarkable.  There was a filling defect involving the two upper pole calyces, it was worrisome for recurrent neoplasm. The remainder of the kidney was unremarkable.  After the retrograde pyelogram, a guidewire was passed to the left renal pelvis, and the inner portion of 38-French access sheath was used to dilate the ureter without difficulty to the kidney.  The 6-French short ureteroscope was then passed alongside the wire without difficulty to the renal pelvis.  However, I was unable to negotiate it into the upper pole.  At this point, the ureteroscope was removed and an 38-cm access sheath was passed over the wire.  The wire and inner core were removed and the 9-French digital scope was then passed to the kidney.  Inspection of the upper pole demonstrated papillary urothelial carcinoma.  No other areas of involvement were noted in the upper pole.  Once initial inspection had been performed, a cup biopsy forceps was used to obtain two generous biopsies from the tumor mass.  A 200-micron laser fiber was then passed through the scope,  this was set on 0.5 joules and 10 hertz.  The tumor was then engaged first in the more lateral calyx, which had the lowest burden.  I was able to ablate the entire tumor burden in this area.  Then, the more medial calyx where the bulk of the tumor was located was treated.  I was able to fulgurate the bulk of the tumor, although there were some concerns that there were some residual untreated tumor around the edge of the calyx on the medial aspect.  I could not angle the scope sufficiently to put direct contact of laser on this area.  At this point, after I had fulgurated all, I felt possible, the  access sheath was advanced to the upper pole and the ureteroscope was removed. A 5-French open-end catheter was then passed to the upper pole and the kidney was flushed with several aliquots of saline to flush debris from the laser procedure.  At this point, a guidewire was placed to the kidney and access sheath was removed.  The cystoscope was reinserted over the wire and a 6-French 24-cm double-J stent without string was passed to the kidney under fluoroscopic guidance.  The wire was removed leaving good coil in the kidney and good coil in the bladder.  The bladder was then drained.  The cystoscope was removed.  A B and O suppository was placed.  She was taken down from lithotomy position.  Her anesthetic was reversed.  She was moved to the recovery room in stable condition.  There were no complications.     Excell Seltzer. [name], M.D.     JJW/MEDQ  D:  03/13/2013  T:  03/14/2013  Job:  [id number]

## 2013-04-03 ENCOUNTER — Other Ambulatory Visit (INDEPENDENT_AMBULATORY_CARE_PROVIDER_SITE_OTHER): Payer: Self-pay | Admitting: *Deleted

## 2013-04-03 ENCOUNTER — Ambulatory Visit (INDEPENDENT_AMBULATORY_CARE_PROVIDER_SITE_OTHER): Payer: Medicare Other | Admitting: Internal Medicine

## 2013-04-03 ENCOUNTER — Encounter (INDEPENDENT_AMBULATORY_CARE_PROVIDER_SITE_OTHER): Payer: Self-pay | Admitting: *Deleted

## 2013-04-03 ENCOUNTER — Encounter (INDEPENDENT_AMBULATORY_CARE_PROVIDER_SITE_OTHER): Payer: Self-pay | Admitting: Internal Medicine

## 2013-04-03 VITALS — BP 108/50 | HR 64 | Temp 97.8°F | Ht 65.0 in | Wt 102.4 lb

## 2013-04-03 DIAGNOSIS — D3A092 Benign carcinoid tumor of the stomach: Secondary | ICD-10-CM | POA: Insufficient documentation

## 2013-04-03 DIAGNOSIS — C169 Malignant neoplasm of stomach, unspecified: Secondary | ICD-10-CM

## 2013-04-03 NOTE — Progress Notes (Signed)
Subjective:     Patient ID: [name], female   DOB: Nov 11, 1922, 79 y.o.   MRN: [id number]  HPI   Here today for f/u. Hx of gastric carcinoid. Due for surveillance EGD.  She tells me she is doing good. Appetite is good. She has lost 13 pounds since March. Sometimes she does feel queasy. No abdominal pain. She usually has a BM daily. No melena or bright red rectal bleeding.  Here today to schedule a surveillance EGD for gastric carcinoid.    09/01/2012 EGD: Procedure: EGD  Indications: Patient is 79 year old Caucasian female with large pedunculated polyp removed from her stomach on 05/16/2012. This polyp had invasive carcinoma with polypectomy site was clean. She had immediate post-polypectomy bleed controlled with Hemoclip application and appendectomy injection.  She is on returning for followup exam to make sure she does not have local recurrence.  Impression:  No evidence of local recurrence at polypectomy site. Single clip remains at polypectomy site.  Multiple small polyps the gastric body 4 of which were biopsied for histology.  Small submucosal lesion in the antrum unchanged from previous exam of 10 weeks ago.  03/13/2013   1. Cystoscopy with bilateral retrograde pyelograms with  interpretation.  2. Left ureteroscopy with biopsy and fulguration.  3. Insertion of left double-J stent. POSTOPERATIVE DIAGNOSIS: Left upper pole caliceal urothelial carcinoma.        Biopsy: It shows carcinoid. He had multiple small polyps. Biopsy results also reviewed with Dr. Ouida Sills. Recommend observation. Repeat EGD in 6 months and resect the polyps which are enlarging. Office visit in 6 months prior to scheduling any procedure. Report to Dr. Ouida Sills     Review of Systems Married. Retired from book keeping.  One son in good health.    Objective:   Physical Exam  Filed Vitals:   04/03/13 1428  BP: 108/50  Pulse: 64  Temp: 97.8 F (36.6 C)  Height: 5\' 5"  (1.651 m)  Weight: 102 lb 6.4 oz  (46.448 kg)  Alert and oriented. Skin warm and dry. Oral mucosa is moist.   . Sclera anicteric, conjunctivae is pink. Thyroid not enlarged. No cervical lymphadenopathy. Lungs clear. Heart regular rate and rhythm.  Abdomen is soft. Bowel sounds are positive. No hepatomegaly. No abdominal masses felt. No tenderness.  No edema to lower extremities       Assessment:    Gastric carcinoid: needs surveillance EGD    Plan:   Surveillance EGD for hx of carcinoid.

## 2013-04-03 NOTE — Patient Instructions (Signed)
Surveillance EGD 

## 2013-04-12 ENCOUNTER — Encounter (HOSPITAL_COMMUNITY): Payer: Self-pay | Admitting: Pharmacy Technician

## 2013-04-20 ENCOUNTER — Other Ambulatory Visit (HOSPITAL_COMMUNITY): Payer: Self-pay | Admitting: Internal Medicine

## 2013-04-20 ENCOUNTER — Ambulatory Visit (HOSPITAL_COMMUNITY)
Admission: RE | Admit: 2013-04-20 | Discharge: 2013-04-20 | Disposition: A | Discharge status: Home / Self Care | Payer: Medicare Other | Source: Ambulatory Visit | Attending: Internal Medicine | Admitting: Internal Medicine

## 2013-04-20 DIAGNOSIS — R110 Nausea: Secondary | ICD-10-CM | POA: Insufficient documentation

## 2013-04-20 DIAGNOSIS — R634 Abnormal weight loss: Secondary | ICD-10-CM | POA: Insufficient documentation

## 2013-04-20 DIAGNOSIS — R109 Unspecified abdominal pain: Secondary | ICD-10-CM | POA: Insufficient documentation

## 2013-04-20 MED ORDER — IOHEXOL 300 MG/ML  SOLN
80.0000 mL | Freq: Once | INTRAMUSCULAR | Status: AC | PRN
  Administered 2013-04-20: 80 mL via INTRAVENOUS

## 2013-04-23 ENCOUNTER — Inpatient Hospital Stay (HOSPITAL_COMMUNITY)
Admission: EM | Admit: 2013-04-23 | Discharge: 2013-04-25 | DRG: 640 | Disposition: A | Discharge status: Hospice | Payer: Medicare Other | Attending: Internal Medicine | Admitting: Internal Medicine

## 2013-04-23 ENCOUNTER — Emergency Department (HOSPITAL_COMMUNITY): Payer: Medicare Other

## 2013-04-23 ENCOUNTER — Other Ambulatory Visit: Payer: Self-pay

## 2013-04-23 ENCOUNTER — Encounter (HOSPITAL_COMMUNITY): Payer: Self-pay | Admitting: Emergency Medicine

## 2013-04-23 DIAGNOSIS — E860 Dehydration: Principal | ICD-10-CM

## 2013-04-23 DIAGNOSIS — C7A092 Malignant carcinoid tumor of the stomach: Secondary | ICD-10-CM | POA: Diagnosis present

## 2013-04-23 DIAGNOSIS — W010XXA Fall on same level from slipping, tripping and stumbling without subsequent striking against object, initial encounter: Secondary | ICD-10-CM | POA: Diagnosis present

## 2013-04-23 DIAGNOSIS — Z681 Body mass index (BMI) 19 or less, adult: Secondary | ICD-10-CM

## 2013-04-23 DIAGNOSIS — E039 Hypothyroidism, unspecified: Secondary | ICD-10-CM | POA: Diagnosis present

## 2013-04-23 DIAGNOSIS — N390 Urinary tract infection, site not specified: Secondary | ICD-10-CM | POA: Diagnosis present

## 2013-04-23 DIAGNOSIS — E785 Hyperlipidemia, unspecified: Secondary | ICD-10-CM | POA: Diagnosis present

## 2013-04-23 DIAGNOSIS — IMO0002 Reserved for concepts with insufficient information to code with codable children: Secondary | ICD-10-CM | POA: Diagnosis present

## 2013-04-23 DIAGNOSIS — J449 Chronic obstructive pulmonary disease, unspecified: Secondary | ICD-10-CM | POA: Diagnosis present

## 2013-04-23 DIAGNOSIS — I129 Hypertensive chronic kidney disease with stage 1 through stage 4 chronic kidney disease, or unspecified chronic kidney disease: Secondary | ICD-10-CM | POA: Diagnosis present

## 2013-04-23 DIAGNOSIS — N183 Chronic kidney disease, stage 3 unspecified: Secondary | ICD-10-CM | POA: Diagnosis present

## 2013-04-23 DIAGNOSIS — Z7401 Bed confinement status: Secondary | ICD-10-CM

## 2013-04-23 DIAGNOSIS — C7889 Secondary malignant neoplasm of other digestive organs: Secondary | ICD-10-CM | POA: Diagnosis present

## 2013-04-23 DIAGNOSIS — E119 Type 2 diabetes mellitus without complications: Secondary | ICD-10-CM | POA: Diagnosis present

## 2013-04-23 DIAGNOSIS — E43 Unspecified severe protein-calorie malnutrition: Secondary | ICD-10-CM | POA: Insufficient documentation

## 2013-04-23 DIAGNOSIS — C669 Malignant neoplasm of unspecified ureter: Secondary | ICD-10-CM | POA: Diagnosis present

## 2013-04-23 DIAGNOSIS — J4489 Other specified chronic obstructive pulmonary disease: Secondary | ICD-10-CM | POA: Diagnosis present

## 2013-04-23 DIAGNOSIS — R627 Adult failure to thrive: Secondary | ICD-10-CM | POA: Diagnosis present

## 2013-04-23 DIAGNOSIS — Z806 Family history of leukemia: Secondary | ICD-10-CM

## 2013-04-23 DIAGNOSIS — N179 Acute kidney failure, unspecified: Secondary | ICD-10-CM

## 2013-04-23 DIAGNOSIS — Y92009 Unspecified place in unspecified non-institutional (private) residence as the place of occurrence of the external cause: Secondary | ICD-10-CM

## 2013-04-23 DIAGNOSIS — C787 Secondary malignant neoplasm of liver and intrahepatic bile duct: Secondary | ICD-10-CM | POA: Diagnosis present

## 2013-04-23 DIAGNOSIS — D72829 Elevated white blood cell count, unspecified: Secondary | ICD-10-CM

## 2013-04-23 DIAGNOSIS — E7800 Pure hypercholesterolemia, unspecified: Secondary | ICD-10-CM | POA: Diagnosis present

## 2013-04-23 LAB — COMPREHENSIVE METABOLIC PANEL
ALT: 26 U/L (ref 0–35)
AST: 60 U/L — ABNORMAL HIGH (ref 0–37)
Albumin: 2.9 g/dL — ABNORMAL LOW (ref 3.5–5.2)
Alkaline Phosphatase: 332 U/L — ABNORMAL HIGH (ref 39–117)
BUN: 46 mg/dL — ABNORMAL HIGH (ref 6–23)
CALCIUM: 9.5 mg/dL (ref 8.4–10.5)
CO2: 24 meq/L (ref 19–32)
Chloride: 92 mEq/L — ABNORMAL LOW (ref 96–112)
Creatinine, Ser: 1.43 mg/dL — ABNORMAL HIGH (ref 0.50–1.10)
GFR calc Af Amer: 37 mL/min — ABNORMAL LOW (ref >90)
GFR, EST NON AFRICAN AMERICAN: 32 mL/min — AB (ref >90)
Glucose, Bld: 104 mg/dL — ABNORMAL HIGH (ref 70–99)
Potassium: 4.5 mEq/L (ref 3.7–5.3)
SODIUM: 138 meq/L (ref 137–147)
Total Bilirubin: 1.6 mg/dL — ABNORMAL HIGH (ref 0.3–1.2)
Total Protein: 7 g/dL (ref 6.0–8.3)

## 2013-04-23 LAB — CBC WITH DIFFERENTIAL/PLATELET
BASOS ABS: 0 10*3/uL (ref 0.0–0.1)
BASOS PCT: 0 % (ref 0–1)
Eosinophils Absolute: 0 10*3/uL (ref 0.0–0.7)
Eosinophils Relative: 0 % (ref 0–5)
HCT: 34.9 % — ABNORMAL LOW (ref 36.0–46.0)
HEMOGLOBIN: 11.1 g/dL — AB (ref 12.0–15.0)
Lymphocytes Relative: 4 % — ABNORMAL LOW (ref 12–46)
Lymphs Abs: 0.8 10*3/uL (ref 0.7–4.0)
MCH: 29.1 pg (ref 26.0–34.0)
MCHC: 31.8 g/dL (ref 30.0–36.0)
MCV: 91.4 fL (ref 78.0–100.0)
Monocytes Absolute: 1.4 10*3/uL — ABNORMAL HIGH (ref 0.1–1.0)
Monocytes Relative: 7 % (ref 3–12)
NEUTROS ABS: 17.1 10*3/uL — AB (ref 1.7–7.7)
Neutrophils Relative %: 89 % — ABNORMAL HIGH (ref 43–77)
Platelets: 197 10*3/uL (ref 150–400)
RBC: 3.82 MIL/uL — ABNORMAL LOW (ref 3.87–5.11)
RDW: 15 % (ref 11.5–15.5)
WBC: 19.3 10*3/uL — ABNORMAL HIGH (ref 4.0–10.5)

## 2013-04-23 LAB — URINE MICROSCOPIC-ADD ON

## 2013-04-23 LAB — URINALYSIS, ROUTINE W REFLEX MICROSCOPIC
Glucose, UA: NEGATIVE mg/dL
Ketones, ur: 40 mg/dL — AB
Nitrite: POSITIVE — AB
PH: 5 (ref 5.0–8.0)
Protein, ur: 100 mg/dL — AB
SPECIFIC GRAVITY, URINE: 1.025 (ref 1.005–1.030)
UROBILINOGEN UA: 2 mg/dL — AB (ref 0.0–1.0)

## 2013-04-23 LAB — TROPONIN I: Troponin I: <0.3 ng/mL (ref <0.30)

## 2013-04-23 MED ORDER — ENOXAPARIN SODIUM 30 MG/0.3ML ~~LOC~~ SOLN
30.0000 mg | SUBCUTANEOUS | Status: DC
  Administered 2013-04-23 – 2013-04-24 (×2): 30 mg via SUBCUTANEOUS
  Filled 2013-04-23 (×2): qty 0.3

## 2013-04-23 MED ORDER — SODIUM CHLORIDE 0.9 % IV BOLUS (SEPSIS)
1000.0000 mL | Freq: Once | INTRAVENOUS | Status: AC
  Administered 2013-04-23: 1000 mL via INTRAVENOUS

## 2013-04-23 MED ORDER — CEFTRIAXONE SODIUM 1 G IJ SOLR
1.0000 g | INTRAMUSCULAR | Status: DC
  Filled 2013-04-23: qty 10

## 2013-04-23 MED ORDER — DEXTROSE 5 % IV SOLN
1.0000 g | INTRAVENOUS | Status: DC
  Administered 2013-04-24: 1 g via INTRAVENOUS
  Filled 2013-04-23 (×2): qty 10

## 2013-04-23 MED ORDER — DEXTROSE 5 % IV SOLN
1.0000 g | Freq: Once | INTRAVENOUS | Status: AC
  Administered 2013-04-23: 1 g via INTRAVENOUS
  Filled 2013-04-23: qty 10

## 2013-04-23 MED ORDER — ALUM & MAG HYDROXIDE-SIMETH 200-200-20 MG/5ML PO SUSP: 30.0000 mL | Freq: Four times a day (QID) | ORAL | Status: DC | PRN

## 2013-04-23 MED ORDER — ONDANSETRON HCL 4 MG/2ML IJ SOLN: 4.0000 mg | Freq: Four times a day (QID) | INTRAMUSCULAR | Status: DC | PRN

## 2013-04-23 MED ORDER — PANTOPRAZOLE SODIUM 40 MG PO TBEC
40.0000 mg | DELAYED_RELEASE_TABLET | Freq: Two times a day (BID) | ORAL | Status: DC
  Administered 2013-04-23 – 2013-04-24 (×4): 40 mg via ORAL
  Filled 2013-04-23 (×5): qty 1

## 2013-04-23 MED ORDER — LEVOTHYROXINE SODIUM 88 MCG PO TABS
88.0000 ug | ORAL_TABLET | Freq: Every day | ORAL | Status: DC
  Administered 2013-04-24 – 2013-04-25 (×2): 88 ug via ORAL
  Filled 2013-04-23 (×2): qty 1

## 2013-04-23 MED ORDER — HYDROCODONE-ACETAMINOPHEN 5-325 MG PO TABS: 1.0000 | ORAL_TABLET | Freq: Four times a day (QID) | ORAL | Status: DC | PRN

## 2013-04-23 MED ORDER — ACETAMINOPHEN 650 MG RE SUPP: 650.0000 mg | Freq: Four times a day (QID) | RECTAL | Status: DC | PRN

## 2013-04-23 MED ORDER — SODIUM CHLORIDE 0.9 % IV SOLN: INTRAVENOUS | Status: DC

## 2013-04-23 MED ORDER — ONDANSETRON HCL 4 MG PO TABS: 4.0000 mg | ORAL_TABLET | Freq: Four times a day (QID) | ORAL | Status: DC | PRN

## 2013-04-23 MED ORDER — METOPROLOL TARTRATE 50 MG PO TABS
50.0000 mg | ORAL_TABLET | Freq: Every day | ORAL | Status: DC
Start: 2013-04-23 — End: 2013-04-23
  Administered 2013-04-23: 50 mg via ORAL
  Filled 2013-04-23: qty 2

## 2013-04-23 MED ORDER — METOPROLOL TARTRATE 50 MG PO TABS
50.0000 mg | ORAL_TABLET | Freq: Two times a day (BID) | ORAL | Status: DC
  Administered 2013-04-24 (×2): 50 mg via ORAL
  Filled 2013-04-23 (×2): qty 2
  Filled 2013-04-23: qty 1

## 2013-04-23 MED ORDER — ACETAMINOPHEN 325 MG PO TABS: 650.0000 mg | ORAL_TABLET | Freq: Four times a day (QID) | ORAL | Status: DC | PRN

## 2013-04-23 MED ORDER — ONDANSETRON HCL 4 MG/2ML IJ SOLN
4.0000 mg | Freq: Once | INTRAMUSCULAR | Status: AC
  Administered 2013-04-23: 4 mg via INTRAVENOUS
  Filled 2013-04-23: qty 2

## 2013-04-23 MED ORDER — ENOXAPARIN SODIUM 30 MG/0.3ML ~~LOC~~ SOLN: 30.0000 mg | SUBCUTANEOUS | Status: DC

## 2013-04-23 MED ORDER — SODIUM CHLORIDE 0.9 % IV SOLN
INTRAVENOUS | Status: DC
  Administered 2013-04-23 – 2013-04-24 (×2): via INTRAVENOUS

## 2013-04-23 NOTE — Progress Notes (Signed)
[name] is a 80 y.o. female patient who transferred  from ED awake, alert  & orientated  X 3, Full Code, VSS - Blood pressure 112/48, pulse 75, temperature 97.4 F (36.3 C), temperature source Axillary, resp. rate 18, height 5\' 5"  (1.651 m), weight 42.1 kg (92 lb 13 oz), SpO2 100.00%. No c/o shortness of breath, no c/o chest pain, no distress noted.   IV site WDL: Left forearm with a transparent dsg that's clean dry and intact.  Allergies:   Allergies  Allergen Reactions  . Latex Rash     Past Medical History  Diagnosis Date  . Hypertension   . Hyperlipemia   . COPD (chronic obstructive pulmonary disease)   . Hypothyroidism   . Frequency of urination   . Incontinence of urine   . Diabetes mellitus type 2, diet-controlled     CBG CHECK 3 TIMES [facility]  . 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 History of transurethral destruction of bladder lesion S/P  BX'S AND FULGURATION'S OF TUMORS  . Nocturia   . Gross hematuria   . 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 History of bladder cancer     S/P TURBT'S    Pt orientation to unit, room and routine. SR up x 2, fall risk assessment complete with Patient and family verbalizing understanding of risks associated with falls. Pt verbalizes an understanding of how to use the call bell and to call for help before getting out of bed.  Skin, clean-dry- Laceration to the left foot and skin tear to the right elbow. Bruise to the right forehead.    Will cont to monitor and assist as needed.  Regino Bellow, RN 04/23/2013 5:35 PM

## 2013-04-23 NOTE — ED Notes (Signed)
Pt comes from home via EMS after family called out due to pt's increased weakness and failure to thrive. Family states that pt has not been able to care for herself as she usually does. Family also reports fall that occurred last night. Pt is A&O x4. Pt currently denies any pain. Family reports "big decrease in appetite".

## 2013-04-23 NOTE — ED Provider Notes (Signed)
TIME SEEN: 10:49 AM   CHIEF COMPLAINT: failure to thrive  HPI: HPI Comments: [name] is a 80 y.o. 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 Pt's family states that she is weak and is unable to take care of herself as she used to.  She denies fever, chills, diarrhea, vomiting, SOB, cough, dysuria, or hematuria.  Last night pt fell after slipping on a rug resulting in a hematoma to the right side of her head, skin tear to the right elbow, abrasion to the right knee, laceration to the left dorsal foot.  No loss of consciousness. No preceding symptoms that caused her fall.  Pt is currently taking 81 mg of aspirin. No other anticoagulation.   Asencion Noble, MD   ROS: See HPI Constitutional: no fever  Eyes: no drainage  ENT: no runny nose   Cardiovascular:  no chest pain  Resp: no SOB  GI: no vomiting GU: no dysuria Integumentary: no rash  Allergy: no hives  Musculoskeletal: no leg swelling  Neurological: no slurred speech ROS otherwise negative  PAST MEDICAL HISTORY/PAST SURGICAL HISTORY:  Past Medical History  Diagnosis Date  . Hypertension   . Hyperlipemia   . COPD (chronic obstructive pulmonary disease)   . Hypothyroidism   . Frequency of urination   . Incontinence of urine   . Diabetes mellitus type 2, diet-controlled     CBG CHECK 3 TIMES WKLY  . 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 History of transurethral destruction of bladder lesion S/P  BX'S AND FULGURATION'S OF TUMORS  . Nocturia   . Gross hematuria   . 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 History of bladder cancer     S/P TURBT'S    MEDICATIONS:  Prior to Admission medications   Medication Sig Start Date End Date Taking? Authorizing Provider  amLODipine (NORVASC) 10 MG tablet Take 10 mg by mouth every morning.     Historical Provider, MD  aspirin EC 81 MG tablet Take 81 mg by mouth daily.    Historical Provider, MD  hydrochlorothiazide (HYDRODIURIL) 25 MG tablet Take 25 mg by mouth every morning.     Historical Provider, MD  HYDROcodone-acetaminophen (NORCO) 5-325 MG per tablet Take 1 tablet by mouth every 6 (six) hours as needed for moderate pain. 03/13/13   Irine Seal, MD  levothyroxine (SYNTHROID, LEVOTHROID) 88 MCG tablet Take 88 mcg by mouth daily before breakfast.    Historical Provider, MD  metoprolol (LOPRESSOR) 50 MG tablet Take 50 mg by mouth daily.    Historical Provider, MD  pantoprazole (PROTONIX) 40 MG tablet Take 40 mg by mouth daily.    Historical Provider, MD  potassium chloride SA (K-DUR,KLOR-CON) 20 MEQ tablet Take 20 mEq by mouth 2 (two) times daily.     Historical Provider, MD  simvastatin (ZOCOR) 20 MG tablet Take 20 mg by mouth every evening.    Historical Provider, MD  vitamin C (ASCORBIC ACID) 500 MG tablet Take 500 mg by mouth daily.    Historical Provider, MD    ALLERGIES:  Allergies  Allergen Reactions  . Latex Rash    SOCIAL HISTORY:  History  Substance Use Topics  . Smoking status: Never Smoker   . Smokeless tobacco: Not on file  . Alcohol Use: No    FAMILY HISTORY: History reviewed. No pertinent family history.  EXAM: There were no vitals taken for this visit. CONSTITUTIONAL: Alert and oriented and responds appropriately to questions. Well-appearing; well-nourished HEAD: Normocephalic, ecchymosis to right forehead EYES: Conjunctivae clear, PERRL ENT: normal nose; no rhinorrhea; dry mucous membranes; pharynx without lesions noted NECK: Supple, no meningismus, no LAD  CARD: RRR; S1 and S2 appreciated; no  murmurs, no clicks, no rubs, no gallops RESP: Normal chest excursion without splinting or tachypnea; breath sounds clear and equal bilaterally; no wheezes, no rhonchi, no rales,  ABD/GI: Normal bowel sounds; non-distended; soft, non-tender, no rebound, no guarding BACK:  The back appears normal and is non-tender to palpation, there is no CVA tenderness EXT: Normal ROM in all joints; non-tender to palpation; no edema; normal capillary refill; no cyanosis;  5 cm skin tear to right elbow,  Abrasion to right lateral knee; 6 cm laceration to dorsal left foot. SKIN: Normal color for age and race; warm, NEURO: Moves all extremities equally PSYCH: [name] mood and manner are appropriate. Grooming and personal hygiene are appropriate. Neuro: cranial nerves 2-12 intact;/ moves all four extremities equally; normal sensation diffusely  MEDICAL DECISION MAKING: Patient here with decreased appetite and nausea for the past 2 weeks. She has lost approximately 8 pounds per family's report. She does appear dry and exam but is hemodynamically stable. Patient has a hematoma to her right lateral forehead from a mechanical fall last night. She also has skin tears to her right elbow and knee. There is a laceration to the left dorsal foot be given her fall was greater than 12 hours ago, I discussed with family that I do not feel risk of infection do to delayed closure outweigh the benefits. Will obtain CT imaging of her head and cervical spine. No other bony injury on exam. She has been ambulatory. Will also obtain basic labs, chest x-ray and urine to evaluate for possible organic causes for her decreased appetite and nausea. We'll give IV fluids. Have discussed with family that patient has metastatic cancer and this may be the cause of her symptoms.  ED PROGRESS: Patient's labs show acute on chronic renal failure and UTI. We'll obtain blood cultures and give ceftriaxone. Discussed with Dr. Willey Blade for admission. He will  place orders. Will continue IV fluids. CT imaging and chest x-ray negative.    EKG Interpretation    Date/Time:  Monday April 23 2013 11:18:54 EST Ventricular Rate:  87 PR Interval:  196 QRS Duration: 72 QT Interval:  366 QTC Calculation: 440 R Axis:   45 Text Interpretation:  Normal sinus rhythm Possible Anterior infarct (cited on or before 13-Mar-2013) Abnormal ECG When compared with ECG of 13-Mar-2013 06:10, PR interval has decreased Vent. rate has increased BY  31 BPM Confirmed by WARD  DO, KRISTEN ([id number]) on 04/23/2013 1:14:26 PM             [place], DO 04/23/13 1611

## 2013-04-24 DIAGNOSIS — E860 Dehydration: Principal | ICD-10-CM

## 2013-04-24 DIAGNOSIS — Z8559 Personal history of malignant neoplasm of other urinary tract organ: Secondary | ICD-10-CM

## 2013-04-24 DIAGNOSIS — Z8551 Personal history of malignant neoplasm of bladder: Secondary | ICD-10-CM

## 2013-04-24 DIAGNOSIS — N179 Acute kidney failure, unspecified: Secondary | ICD-10-CM

## 2013-04-24 DIAGNOSIS — E43 Unspecified severe protein-calorie malnutrition: Secondary | ICD-10-CM | POA: Insufficient documentation

## 2013-04-24 DIAGNOSIS — IMO0002 Reserved for concepts with insufficient information to code with codable children: Secondary | ICD-10-CM | POA: Diagnosis present

## 2013-04-24 NOTE — Progress Notes (Signed)
INITIAL NUTRITION ASSESSMENT  DOCUMENTATION CODES Per approved criteria  -Severe malnutrition in the context of chronic illness -Underweight   INTERVENTION: Agree with regular diet and comfort feeding   NUTRITION DIAGNOSIS: Malnutrition (severe) related to inability to meet nutrition needs with oral intake as evidenced by anorexia, and unplanned wt loss of 14#, 13% <60 days.   Goal: Meet nutrition needs as able. Honor pt wishes regarding healthcare goals. Provide support.  Monitor:   Po intake, labs and wt trends   Reason for Assessment: Malnutrition Screen Score =  2  80 y.o. female   ASSESSMENT: Pt presents with recent hx of severe wt loss and anorexia. Her medical problems include gastric carcinoma, left ureteral carcinoma and concern for metastatic cancer to her liver. She is not interested in receiving nutrition supplements and in fact is planning to d/c home with hospice.  She meets criteria for severe malnutrition in the context of chronic illness considering her severe weight loss (13% <60 days) and minimal po intake (</=75% for >/= month). Also muscle wasting upper body (clavicle, arms and temporal).    Height: Ht Readings from Last 1 Encounters:  04/23/13 5\' 5"  (1.651 m)    Weight: Wt Readings from Last 1 Encounters:  04/24/13 93 lb 4.1 oz (42.3 kg)    Ideal Body Weight: 125# (56.8 kg)  % Ideal Body Weight: 74%  Wt Readings from Last 10 Encounters:  04/24/13 93 lb 4.1 oz (42.3 kg)  04/03/13 102 lb 6.4 oz (46.448 kg)  03/13/13 107 lb (48.535 kg)  03/13/13 107 lb (48.535 kg)  09/01/12 117 lb (53.071 kg)  09/01/12 117 lb (53.071 kg)  06/20/12 115 lb 8 oz (52.39 kg)  05/12/12 116 lb 4.8 oz (52.753 kg)  01/18/12 115 lb (52.164 kg)  01/18/12 115 lb (52.164 kg)    Usual Body Weight: 107-117#  % Usual Body Weight: 87%  BMI:  Body mass index is 15.52 kg/(m^2).underweight  Estimated Nutritional Needs: Kcal: 2202-5427 Protein: 46-55 gr  Fluid: 1300 ml  daily  Skin: laceration to foot and elbow  Diet Order: General  EDUCATION NEEDS: -Education not appropriate at this time   Intake/Output Summary (Last 24 hours) at 04/24/13 1222 Last data filed at 04/24/13 0600  Gross per 24 hour  Intake    980 ml  Output      0 ml  Net    980 ml    Last BM: 04/22/12  Labs:   Recent Labs Lab 04/23/13 1156  NA 138  K 4.5  CL 92*  CO2 24  BUN 46*  CREATININE 1.43*  CALCIUM 9.5  GLUCOSE 104*    CBG (last 3)  No results found for this basename: GLUCAP,  in the last 72 hours  Scheduled Meds: . cefTRIAXone (ROCEPHIN)  IV  1 g Intravenous Q24H  . enoxaparin (LOVENOX) injection  30 mg Subcutaneous Q24H  . levothyroxine  88 mcg Oral QAC breakfast  . metoprolol  50 mg Oral BID  . pantoprazole  40 mg Oral BID    Continuous Infusions: . sodium chloride 50 mL/hr at 04/24/13 0831    Past Medical History  Diagnosis Date  . Hypertension   . Hyperlipemia   . COPD (chronic obstructive pulmonary disease)   . Hypothyroidism   . Frequency of urination   . Incontinence of urine   . Diabetes mellitus type 2, diet-controlled     CBG CHECK 3 TIMES WKLY  . 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 History of transurethral destruction of bladder lesion S/P  BX'S AND FULGURATION'S OF TUMORS  . Nocturia   . Gross hematuria   . 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 History of bladder cancer     S/P TURBT'S    Past Surgical History  Procedure Laterality Date  . Cataract extraction w/ intraocular lens  implant, bilateral    . Left ureteroscopy with fulguration of tumor/ removal and replacement left ureteral stent  06-24-2010    MALIGNANT NEOPLASM LEFT URETER  . Cysto/ left ureteroscopy/ bx ureteral tumor/ stent placement  05-19-2010  . Cysto/ bilateral retrogradce pyelogram/ left ureteroscopy with  fulguration of tumor/ bladder bx and fulgeration of lesion's and tumor  05-18-2011  [name])    HX LEFT URETER TUMOR AND BLADDER TUMORS  . Cystoscopy/retrograde/ureteroscopy  10/14/2011    Procedure: CYSTOSCOPY/RETROGRADE/URETEROSCOPY;  Surgeon: Malka So, MD;  Location: [facility];  Service: Urology;  Laterality: Left;      . Cystoscopy w/ retrogrades  01/18/2012    Procedure: CYSTOSCOPY WITH RETROGRADE PYELOGRAM;  Surgeon: Malka So, MD;  Location: [facility];  Service: Urology;  Laterality: Bilateral;  . Esophagogastroduodenoscopy N/A 05/17/2012    Procedure: ESOPHAGOGASTRODUODENOSCOPY (EGD);  Surgeon: Rogene Houston, MD;  Location: [facility];  Service: Endoscopy;  Laterality: N/A;  . Esophagogastroduodenoscopy N/A 05/16/2012    Procedure: ESOPHAGOGASTRODUODENOSCOPY (EGD);  Surgeon: Rogene Houston, MD;  Location: [facility];  Service: Endoscopy;  Laterality: N/A;  135-rescheduled to 7:30 Ann notified pt  . Esophageal biopsy N/A 05/16/2012    Procedure: BIOPSY;  Surgeon: Rogene Houston, MD;  Location: [facility];  Service: Endoscopy;  Laterality: N/A;  . Esophagogastroduodenoscopy N/A 09/01/2012    Procedure: ESOPHAGOGASTRODUODENOSCOPY (EGD);  Surgeon: Rogene Houston, MD;  Location: [facility];  Service: Endoscopy;  Laterality: N/A;  915-moved to 1050 Ann to notify pt  . Cystoscopy/retrograde/ureteroscopy Bilateral 03/13/2013    Procedure: CYSTOSCOPY/BILATERAL RETROGRADE/LEFT URETEROSCOPY WITH BIOPSY AND FULGERATION;  Surgeon: Irine Seal, MD;  Location: [facility];  Service: Urology;  Laterality: Bilateral;  . Holmium laser application Bilateral 33/11/2503    Procedure: HOLMIUM LASER APPLICATION;  Surgeon: Irine Seal, MD;  Location: [facility];  Service: Urology;  Laterality: Bilateral;    Colman Cater MS,RD,CSG,LDN Office: [phone] Pager: [phone]

## 2013-04-24 NOTE — Progress Notes (Signed)
UR completed.  Patient changed to inpatient r/t requiring IVF and IV antibiotics.  

## 2013-04-24 NOTE — H&P (Signed)
[name], [name]                  ACCOUNT NO.:  [id number]  MEDICAL RECORD NO.:  [id number]  LOCATION:  H846                          FACILITY:  [facility]  PHYSICIAN:  Paula Compton. Willey Blade, MD       DATE OF BIRTH:  10/30/1922  DATE OF ADMISSION:  04/23/2013 DATE OF DISCHARGE:  LH                             HISTORY & PHYSICAL   CHIEF COMPLAINT:  Weakness.  HISTORY OF PRESENT ILLNESS:  This patient is an 80 year old white female, who presented with generalized weakness and anorexia.  The patient had been found to have possible metastatic cancer in her liver, pancreas, and lungs on a CT scan 3 days prior to admission.  She had been seen in the office last Friday with weakness and weight loss.  She had lost approximately 20 pounds in the past 2 months.  She is really not eating or drinking anything of significance now.  She has had nausea.  She denies vomiting.  She does not have abdominal pain.  She previously had adenocarcinoma and a large pedunculated polyp in the stomach last February.  This was removed.  She had followup upper endoscopy and had multiple small polyps in the gastric body.  Biopsy showed carcinoid.  She also has a history of carcinoma of the left ureter which has been managed by Urology.  PAST MEDICAL HISTORY: 1. Gastric carcinoma. 2. Gastric carcinoid. 3. Left ureteral carcinoma. 4. Diabetes. 5. Hypertension. 6. Hyperlipidemia. 7. Renal artery stenosis, status post angioplasty in 2000. 8. Hypothyroidism.  MEDICATIONS:  Levothyroxine 88 mcg daily, simvastatin 20 mg daily, metoprolol 50 mg b.i.d., amlodipine 10 mg daily, vitamin C 500 mg daily, aspirin 81 mg daily.  Hydrochlorothiazide and potassium had been stopped last week.  ALLERGIES:  None.  SOCIAL HISTORY:  She does not smoke, drink, or use recreational substances.  FAMILY HISTORY:  Her mother died of leukemia at 61.  Her father died of an unknown type of cancer at 20.  REVIEW OF SYSTEMS:  She denies dysuria  or gross hematuria.  Her bowels have been moving regularly without melena or rectal bleeding.  PHYSICAL EXAMINATION:  VITAL SIGNS:  Temperature 97.9, pulse 81, blood pressure 129/46, respirations 20, oxygen saturation 100%. GENERAL:  Chronically ill appearing elderly female. HEENT:  No scleral icterus.  Pharynx appears dry. NECK:  Supple with no JVD or thyromegaly. LUNGS:  Faint rales. HEART:  Regular with a grade 2 systolic murmur. ABDOMEN:  Soft and nontender with hepatomegaly palpable in the epigastrium.  No palpable splenomegaly. EXTREMITIES:  No cyanosis, clubbing, or edema. NEURO:  No focal weakness. LYMPH NODES:  No cervical or supraclavicular enlargement. SKIN:  She has bruising on the right side of her head from falling the night before.  She also has a skin tear on her right arm.  IMPRESSION AND PLAN: 1. Possible metastatic carcinoma of the stomach.  Possible carcinoid.     We will consult Oncology. 2. Dehydration.  BUN and creatinine are increased from a baseline of     18 and 1.38 to 46 and 1.43.  Treat with IV fluids. 3. Malnutrition.  Albumin is down to 2.9. 4. Possible urinary tract infection.  She has a leukocytosis with a     left shift.  Her white count is 19.3 with 89% neutrophils and 4%     lymphocytes.  Blood and urine cultures have been obtained.  She has     been started on IV ceftriaxone.  She does not have significant     white cells in the urine, however.  She does have too numerous to     count rbc's. 5. Left ureteral carcinoma. 6. Chronic kidney disease. 7. Renal artery stenosis, status post angioplasty on the right. 8. Hypertension. 9. Hypothyroidism. 10.Hyperlipidemia. 11.History of diabetes. 12.Fall.  She underwent a CT scan of the head which reveals no acute     abnormality.  She also had a CT scan of the cervical spine which     revealed no fracture. 13.Hypothyroidism.  TSH was mildly elevated last month at 7.9, and her     Synthroid dose was  modified to 88 mcg at that time.     Paula Compton. Willey Blade, MD     ROF/MEDQ  D:  04/24/2013  T:  04/24/2013  Job:  [id number]

## 2013-04-24 NOTE — Clinical Social Work Psychosocial (Signed)
Clinical Social Work Department BRIEF PSYCHOSOCIAL ASSESSMENT 04/24/2013  Patient:  [name], [name]     Account Number:  [id number]     Admit date:  04/23/2013  Clinical Social Worker:  Wyatt Haste  Date/Time:  04/24/2013 01:48 PM  Referred by:  Care Management  Date Referred:  04/24/2013 Referred for  ALF Placement   Other Referral:   Interview type:  Patient Other interview type:   husband    PSYCHOSOCIAL DATA Living Status:  HUSBAND Admitted from facility:   Level of care:   Primary support name:  Richard Primary support relationship to patient:  SPOUSE Degree of support available:   supportive    CURRENT CONCERNS Current Concerns  Post-Acute Placement   Other Concerns:    SOCIAL WORK ASSESSMENT / PLAN CSW met with pt and pt's husband at bedside. Pt alert and oriented. She lives with her husband. They have a son who lives in [place]. Upon entering room, pt and husband immediately requested for pt to go to [facility] as they have a family member there. CSW had called [facility] earlier in the day and spoke with administrator and there are no beds available and there is a wait list. Husband had also been told this by facility. CSW discussed other options of SNF/ALF briefly, but then pt stated she wanted to return home. Pt's husband had been told by their insurance agent that there was a 60 day waiting period before her long term care insurance would begin payment. With this information, they were much less interested in placement. Pt and husband made decision to return home with hospice. They are aware that pt could be considered at [facility] when appropriate.    CSW spent time discussing pt's feelings regarding news today. She reports that hospice was first mentioned this morning by PCP and then oncology also brought up topic. Pt is anxious to return home as this is where she is comfortable. She states she is feeling "numb" right now with all of today's information. Pt  reports no worries at this point, just a desire to go home. CSW provided support to pt. She appears to be very sure of decision for hospice.   Assessment/plan status:  Referral to [employer] Other assessment/ plan:   Information/referral to community resources:   CM for home hospice and equipment/Private duty care agency list  SNF/ALF lists    PATIENT'S/FAMILY'S RESPONSE TO PLAN OF CARE: Pt opened up about her news and emotions regarding decision today for hospice. Pt appreciative of CSW visit and support. Husband requested information on private duty care as they feel that they may need additional support in the home. CSW notified CM. Will sign off, but can be reconsulted if needed.       Benay Pike, [place]

## 2013-04-24 NOTE — Consult Note (Signed)
[facility] Consultation Oncology  Name: [name]      MRN: [id number]    Location: A312/A312-01  Date: 04/24/2013 Time:8:34 AM   REFERRING PHYSICIAN:  Asencion Noble, MD  REASON FOR CONSULT:  Metastatic disease   DIAGNOSIS:  Liver metastases, pancreatic lesion in the tail, upper abdominal lymphadenopathy, and focal area of soft tissue prominence along the greater curvature of the stomach  HISTORY OF PRESENT ILLNESS:   [name] is a pleasant 80 year old, caucasian, female who reported to the ED on 04/23/2013 for progressive weakness.  She also has a past medical history of HTN, low-grade urothelial carcinoma S/P resection by Dr. [name] Pollock, hypercholesterolemia, hypothyroidism, and DM.  Chart is reviewed and there are multiple variables taking place in this patient's situation.   Pathology History: 05/19/2010- left distal ureter demonstrating low-grade urothelial carcinoma. 05/18/2011- left bladder neck biopsy demonstrating low-grade papillary urothelial carcinoma 05/18/2011- right lateral wall of bladder demonstrates a low-grade papillary carcinoma of urothelial origin without invasion through the muscularis propria. 05/16/2012- stomach polyp is positive for adenocarcinoma within a tubulovillous adenoma 09/01/2012- stomach biopsy demonstrating a neuroendocrine tumor 03/13/2013- left upper pole calx of renal pelvis biopsy demonstrates low-grade papillary urothelial carcinoma.  Dr. Willey Blade referred the patient to the [facility] yesterday with new CT abd/pelvis findings demonstrating widespread metastatic disease in the abdomen.  The report follows below.   [name] husband called our clinic requesting the patient come into our clinic for IV fluids.  With the patient's husband's description of [name]'s condition, I referred them to the [facility] ED.  She was subsequently admitted to the hospital.  I personally reviewed and went over laboratory results with the patient.  She is noted to  be in acute on chronic renal disease with Alk Phos elevation.  WBC is noted to be elevated as well.   UA is noted to be positive for UTI as well.   I personally reviewed and went over radiographic studies with the patient.  CT of head and cervical spine are stable.  I reviewed the CT of abd/pelvis from 04/20/2013 with the patient.  The images were described in detail.  [name] denies any complaints including fevers, chills, night sweats, nausea, vomiting, pain.  I presented two options to the patient: 1. Hospice.  I spent time with the patient and her husband describing the role of hospice.  I made it clear that hospice will perform symptom management and not administer life saving interventions (including IV fluids).  We had a long conversation regarding Hospice.  The patient is aware that chemotherapy may adversely affect quality of life and may not prove to be beneficial.  Patient education was given regarding Hospice and the services they provide.  The patient understands that studies report that early enrollment in Hospice actually allows the patient to live longer compared to those who enroll in Hospice nearer to end of life.  Hospice will allow the patient to stay at home at end of life or go to a facility for end of life care.  At this point, the patient would like to remain at home.  Hospice provides the patient with a team of providers to help with care including physicians, nurses, aids, chaplains, and social workers.  The patient is certainly Hospice appropriate with a life expectancy of less than 6 months (closer to 3 months). The patient understands that he can be discharged from Hospice at anytime.   2. Biopsies.  Due to the patient's clinical  presentation with 3 malignancy-types biopsy proven over the past few years, biopsy of liver lesion and pancreatic lesion is necessary to formulate a diagnosis.  If her cancer is carcinoid, she may be a candidate for Sandostatin injections to manage symptoms.   However, she is asymptomatic from a serotonin syndrome standpoint.  Systemic IV chemotherapy would be toxic and difficult to administer in this 80 year old female if her biopsy results were positive for adenocarcinoma.   After a long discussion with the patient and husband, all questions were answered to their satisfaction.  They were educated regarding Alize's prognosis and possible life expectance of an additional few months with therapy.    [name] made it clear to me, that she is not interested in invasive intervention and she desire to go home.  She was pleased to learn that Hospice can follow her home and she requests Hospice referral with discharge home tomorrow.  I supported this decision and orders have been placed.   Her husband is having a more difficult time with the patient's diagnosis and prognosis.  He has unrealistic expectations, but certainly not uncommon of community members.  All of his questions were answered.   PAST MEDICAL HISTORY:   Past Medical History  Diagnosis Date  . Hypertension   . Hyperlipemia   . COPD (chronic obstructive pulmonary disease)   . Hypothyroidism   . Frequency of urination   . Incontinence of urine   . Diabetes mellitus type 2, diet-controlled     CBG CHECK 3 TIMES WKLY  . 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 History of transurethral destruction of bladder lesion S/P  BX'S AND FULGURATION'S OF TUMORS  . Nocturia   . Gross hematuria   . 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 History of bladder cancer     S/P TURBT'S    ALLERGIES: Allergies  Allergen Reactions  . Latex Rash      MEDICATIONS: I have reviewed the patient's current medications.     PAST SURGICAL HISTORY Past Surgical History  Procedure Laterality Date  . Cataract extraction w/ intraocular lens  implant, bilateral    . Left  ureteroscopy with fulguration of tumor/ removal and replacement left ureteral stent  06-24-2010    MALIGNANT NEOPLASM LEFT URETER  . Cysto/ left ureteroscopy/ bx ureteral tumor/ stent placement  05-19-2010  . Cysto/ bilateral retrogradce pyelogram/ left ureteroscopy with fulguration of tumor/ bladder bx and fulgeration of lesion's and tumor  05-18-2011  DR [name])    HX LEFT URETER TUMOR AND BLADDER TUMORS  . Cystoscopy/retrograde/ureteroscopy  10/14/2011    Procedure: CYSTOSCOPY/RETROGRADE/URETEROSCOPY;  Surgeon: Malka So, MD;  Location: [facility];  Service: Urology;  Laterality: Left;      . Cystoscopy w/ retrogrades  01/18/2012    Procedure: CYSTOSCOPY WITH RETROGRADE PYELOGRAM;  Surgeon: Malka So, MD;  Location: [facility];  Service: Urology;  Laterality: Bilateral;  . Esophagogastroduodenoscopy N/A 05/17/2012    Procedure: ESOPHAGOGASTRODUODENOSCOPY (EGD);  Surgeon: Rogene Houston, MD;  [facility];  Service: Endoscopy;  Laterality: N/A;  . Esophagogastroduodenoscopy N/A 05/16/2012    Procedure: ESOPHAGOGASTRODUODENOSCOPY (EGD);  Surgeon: Rogene Houston, MD;  [facility];  Service: Endoscopy;  Laterality: N/A;  135-rescheduled to 7:30 Ann notified pt  . Esophageal biopsy N/A  05/16/2012    Procedure: BIOPSY;  Surgeon: Rogene Houston, MD;  [facility];  Service: Endoscopy;  Laterality: N/A;  . Esophagogastroduodenoscopy N/A 09/01/2012    Procedure: ESOPHAGOGASTRODUODENOSCOPY (EGD);  Surgeon: Rogene Houston, MD;  [facility];  Service: Endoscopy;  Laterality: N/A;  915-moved to 1050 Ann to notify pt  . Cystoscopy/retrograde/ureteroscopy Bilateral 03/13/2013    Procedure: CYSTOSCOPY/BILATERAL RETROGRADE/LEFT URETEROSCOPY WITH BIOPSY AND FULGERATION;  Surgeon: Irine Seal, MD;  Location: [facility];  Service: Urology;  Laterality: Bilateral;  . Holmium laser application Bilateral 82/08/537     Procedure: HOLMIUM LASER APPLICATION;  Surgeon: Irine Seal, MD;  Location: [facility];  Service: Urology;  Laterality: Bilateral;    FAMILY HISTORY: History reviewed. No pertinent family history.  SOCIAL HISTORY:  reports that she has never smoked. She does not have any smokeless tobacco history on file. She reports that she does not drink alcohol or use illicit drugs.  PERFORMANCE STATUS: [name] performance status is 3 - Symptomatic, >50% confined to bed  PHYSICAL EXAM: Most Recent Vital Signs: Blood pressure 100/48, pulse 77, temperature 97.2 F (36.2 C), temperature source Oral, resp. rate 18, height $RemoveBe'5\' 5"'ZdNdGxSkU$  (1.651 m), weight 93 lb 4.1 oz (42.3 kg), SpO2 98.00%. General appearance: alert, cooperative, appears stated age and cachectic Head: Normocephalic, without obvious abnormality, right temporal ecchymosis from past fall Eyes: negative findings: lids and lashes normal, conjunctivae and sclerae normal and corneas clear Neck: supple, symmetrical, trachea midline Extremities: extremities normal, atraumatic, no cyanosis or edema Skin: Skin color, texture, turgor normal. No rashes or lesions Neurologic: Grossly normal  LABORATORY DATA:  Results for orders placed during the hospital encounter of 04/23/13 (from the past 48 hour(s))  URINALYSIS, ROUTINE W REFLEX MICROSCOPIC     Status: Abnormal   Collection Time    04/23/13 11:17 AM      Result Value Range   Color, Urine BROWN (*) YELLOW   Comment: BIOCHEMICALS MAY BE AFFECTED BY COLOR   APPearance HAZY (*) CLEAR   Specific Gravity, Urine 1.025  1.005 - 1.030   pH 5.0  5.0 - 8.0   Glucose, UA NEGATIVE  NEGATIVE mg/dL   Hgb urine dipstick LARGE (*) NEGATIVE   Bilirubin Urine MODERATE (*) NEGATIVE   Ketones, ur 40 (*) NEGATIVE mg/dL   Protein, ur 100 (*) NEGATIVE mg/dL   Urobilinogen, UA 2.0 (*) 0.0 - 1.0 mg/dL   Nitrite POSITIVE (*) NEGATIVE   Leukocytes, UA TRACE (*) NEGATIVE  URINE MICROSCOPIC-ADD ON      Status: Abnormal   Collection Time    04/23/13 11:17 AM      Result Value Range   Squamous Epithelial / LPF RARE  RARE   WBC, UA 0-2  <3 WBC/hpf   RBC / HPF TOO NUMEROUS TO COUNT  <3 RBC/hpf   Bacteria, UA FEW (*) RARE  CBC WITH DIFFERENTIAL     Status: Abnormal   Collection Time    04/23/13 11:56 AM      Result Value Range   WBC 19.3 (*) 4.0 - 10.5 K/uL   RBC 3.82 (*) 3.87 - 5.11 MIL/uL   Hemoglobin 11.1 (*) 12.0 - 15.0 g/dL   HCT 34.9 (*) 36.0 - 46.0 %   MCV 91.4  78.0 - 100.0 fL   MCH 29.1  26.0 - 34.0 pg   MCHC 31.8  30.0 - 36.0 g/dL   RDW 15.0  11.5 - 15.5 %   Platelets 197  150 - 400 K/uL   Neutrophils Relative % 89 (*) 43 - 77 %   Neutro Abs 17.1 (*) 1.7 - 7.7 K/uL   Lymphocytes Relative 4 (*) 12 - 46 %   Lymphs Abs 0.8  0.7 - 4.0 K/uL   Monocytes Relative 7  3 - 12 %   Monocytes Absolute 1.4 (*) 0.1 - 1.0 K/uL   Eosinophils Relative 0  0 - 5 %   Eosinophils Absolute 0.0  0.0 - 0.7 K/uL   Basophils Relative 0  0 - 1 %   Basophils Absolute 0.0  0.0 - 0.1 K/uL  COMPREHENSIVE METABOLIC PANEL     Status: Abnormal   Collection Time    04/23/13 11:56 AM      Result Value Range   Sodium 138  137 - 147 mEq/L   Potassium 4.5  3.7 - 5.3 mEq/L   Chloride 92 (*) 96 - 112 mEq/L   CO2 24  19 - 32 mEq/L   Glucose, Bld 104 (*) 70 - 99 mg/dL   BUN 46 (*) 6 - 23 mg/dL   Creatinine, Ser 1.43 (*) 0.50 - 1.10 mg/dL   Calcium 9.5  8.4 - 10.5 mg/dL   Total Protein 7.0  6.0 - 8.3 g/dL   Albumin 2.9 (*) 3.5 - 5.2 g/dL   AST 60 (*) 0 - 37 U/L   ALT 26  0 - 35 U/L   Alkaline Phosphatase 332 (*) 39 - 117 U/L   Total Bilirubin 1.6 (*) 0.3 - 1.2 mg/dL   GFR calc non Af Amer 32 (*) >90 mL/min   GFR calc Af Amer 37 (*) >90 mL/min   Comment: (NOTE)     The eGFR has been calculated using the CKD EPI equation.     This calculation has not been validated in all clinical situations.     eGFR's persistently <90 mL/min signify possible Chronic Kidney     Disease.  TROPONIN I     Status:  None   Collection Time    04/23/13 11:56 AM      Result Value Range   Troponin I <0.30  <0.30 ng/mL   Comment:            Due to the release kinetics of cTnI,     a negative result within the first hours     of the onset of symptoms does not rule out     myocardial infarction with certainty.     If myocardial infarction is still suspected,     repeat the test at appropriate intervals.      RADIOGRAPHY: Dg Chest 2 View  04/23/2013   CLINICAL DATA:  Hypertension.  EXAM: CHEST  2 VIEW  COMPARISON:  05/19/2010  FINDINGS: Mediastinum and hilar structures are normal. The lungs are clear of acute infiltrates. Changes of COPD and interstitial fibrosis noted. No pleural effusion or pneumothorax. Heart size normal. No acute bony abnormality. Degenerative changes thoracic spine. Mild lower thoracic spine stable vertebral body compression. Contrast noted in the colon.  IMPRESSION: COPD and pulmonary interstitial fibrosis.  No acute abnormality.   Electronically Signed   By: Marcello Moores  Register   On: 04/23/2013 12:41   Ct Head Wo Contrast  04/23/2013   CLINICAL DATA:  Fall  EXAM: CT HEAD WITHOUT CONTRAST  CT CERVICAL SPINE WITHOUT CONTRAST  TECHNIQUE: Multidetector CT imaging of the head and cervical spine was performed following the standard protocol without intravenous contrast. Multiplanar CT image reconstructions of the  cervical spine were also generated.  COMPARISON:  None.  FINDINGS: CT HEAD FINDINGS  Chronic ischemic changes in the right cerebellar hemisphere and periventricular white matter. There is no mass effect, midline shift, or acute intracranial hemorrhage. Cranium is intact. Mastoid air cells and visualized paranasal sinuses are clear.  CT CERVICAL SPINE FINDINGS  No acute fracture or dislocation in the cervical spine.  Multilevel degenerative changes present.  C2-3: Prominent left facet arthropathy.  C3-4: Prominent left facet arthropathy. Posterior osteophytes result in some central stenosis.   C4-5: Right facet arthropathy, right central stenosis, right foraminal narrowing.  C5-6: Posterior osteophytes, right foraminal narrowing. Right central stenosis.  C6-7: Significant right facet arthropathy. Right foraminal narrowing. Mild posterior disc osteophytes.  C7-T1:  Right-sided facet arthropathy.  IMPRESSION: No acute intracranial pathology.  No significant injury in the cervical spine. Degenerative changes are noted.   Electronically Signed   By: Maryclare Bean M.D.   On: 04/23/2013 12:43   Ct Cervical Spine Wo Contrast  04/23/2013   CLINICAL DATA:  Fall  EXAM: CT HEAD WITHOUT CONTRAST  CT CERVICAL SPINE WITHOUT CONTRAST  TECHNIQUE: Multidetector CT imaging of the head and cervical spine was performed following the standard protocol without intravenous contrast. Multiplanar CT image reconstructions of the cervical spine were also generated.  COMPARISON:  None.  FINDINGS: CT HEAD FINDINGS  Chronic ischemic changes in the right cerebellar hemisphere and periventricular white matter. There is no mass effect, midline shift, or acute intracranial hemorrhage. Cranium is intact. Mastoid air cells and visualized paranasal sinuses are clear.  CT CERVICAL SPINE FINDINGS  No acute fracture or dislocation in the cervical spine.  Multilevel degenerative changes present.  C2-3: Prominent left facet arthropathy.  C3-4: Prominent left facet arthropathy. Posterior osteophytes result in some central stenosis.  C4-5: Right facet arthropathy, right central stenosis, right foraminal narrowing.  C5-6: Posterior osteophytes, right foraminal narrowing. Right central stenosis.  C6-7: Significant right facet arthropathy. Right foraminal narrowing. Mild posterior disc osteophytes.  C7-T1:  Right-sided facet arthropathy.  IMPRESSION: No acute intracranial pathology.  No significant injury in the cervical spine. Degenerative changes are noted.   Electronically Signed   By: Maryclare Bean M.D.   On: 04/23/2013 12:43       ASSESSMENT:  1.  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   2. Low-grade papillary urothelial carcinoma, followed by Dr. [name] Pollock. 3. Neuroendocrine tumor of stomach, biopsied by Dr. Laural Golden 4. Adenocarcinoma of stomach, biopsied by Dr. Laural Golden 5. Cachexia 6. Failure to thrive 7. Weight loss 8. Decreased appetite  Patient Active Problem List   Diagnosis Date Noted  . UTI (lower urinary tract infection) 04/23/2013  . 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 Hypertension 05/12/2012  . Renal cancer 05/12/2012  . Bladder cancer 05/12/2012  . High cholesterol 05/12/2012  . Hypothyroid 05/12/2012  . Diabetes 05/12/2012    PLAN:  1. I personally reviewed and went over laboratory results with the patient. 2. I personally reviewed and went over radiographic studies with the patient. 3. Discussed options with the patient as dictated above:  A. Hospice  B. Biopsies 4. All questions were answered 5. Patient education regarding Hospice 6. Patient education regarding biopsy and possible outcomes 7. Patient education regarding Prognosis   A. Poor/Terminal with expected life expectancy of 3 months or less 8. Labs today: CEA, CA 19-9, Chromogranin A  9. Patient education regarding treatment and possibility of adding a few months of life 10. Patient education regarding the role of Hospice and their ability to care for Liesa at home 11. Patient has come to the conclusion of her own to request referral to Hospice. Order placed.  Recommend discussion regarding code status.  All questions were answered. The patient knows to call the clinic with any problems, questions or concerns. We can certainly see the patient much sooner if necessary.  Patient and plan discussed with Dr. Farrel Gobble and he is in agreement with the aforementioned.     Jenae Tomasello

## 2013-04-24 NOTE — Care Management Note (Addendum)
    Page 1 of 1   04/25/2013     9:22:01 AM   CARE MANAGEMENT NOTE 04/25/2013  Patient:  [name], [name]   Account Number:  [id number]  Date Initiated:  04/24/2013  Documentation initiated by:  Theophilus Kinds  Subjective/Objective Assessment:   Pt admitted from home with [facility] ca. Pt lives with her husband. PCP and oncology is recommending Hospice.     Action/Plan:   Pt is agreeable to [facility] at home. Referral sent and they will admit pt at discharge. Hospice will arrange a wheelchair at discharge.   Anticipated DC Date:  04/25/2013   Anticipated DC Plan:  [place]  CM consult      PAC Choice  HOSPICE   Choice offered to / List presented to:  C-1 Patient           Status of service:  Completed, signed off Medicare Important Message given?  YES (If response is "NO", the following Medicare IM given date fields will be blank) Date Medicare IM given:  04/25/2013 Date Additional Medicare IM given:    Discharge Disposition:  [place]  Per UR Regulation:    If discussed at Long Length of Stay Meetings, dates discussed:    Comments:  04/25/13 0920 Christinia Gully, RN BSN CM Pt discharged home today with [facility]. [place] Ree of Hospice is aware of discharge. Hospice to arrange delivery of w/c. No other CM needs noted. Pt and pts nurse aware of discharge arrangements.  04/24/13 [place], RN BSN CM

## 2013-04-24 NOTE — Progress Notes (Signed)
[name], DELANCEY                  ACCOUNT NO.:  [id number]  MEDICAL RECORD NO.:  [id number]  LOCATION:                            FACILITY:  [place]  PHYSICIAN:  Paula Compton. Willey Blade, MD       DATE OF BIRTH:  [birth year]  DATE OF PROCEDURE:  04/24/2013 DATE OF DISCHARGE:                                PROGRESS NOTE   HISTORY OF PRESENT ILLNESS:  [name] is feeling better this morning. She denies any nausea.  She has not vomited.  She is not experiencing abdominal pain.  PHYSICAL EXAMINATION:  VITAL SIGNS:  Temperature 97.2, pulse 77, respirations 18, blood pressure 100/48. LUNGS:  Faint rales. HEART:  Regular with a grade 2 systolic murmur. ABDOMEN:  Soft, nontender, with fullness in her epigastrium consistent with hepatomegaly. EXTREMITIES:  No edema. NEURO:  No focal weakness.  IMPRESSION/PLAN: 1. Probable metastatic adenocarcinoma of the stomach.  Oncology     consult today.  Unfortunately, the husband and the patient seemed     to have unrealistic ideas of her current status and prognosis.  I     tried to make things clear on more than one occasion, however,     understanding remains limited on their part.  Hopefully, the     oncologist will be able to clarify matters further for them. 2. Carcinoma of the ureter. 3. Dehydration.  BUN and creatinine were 46 and 1.43.  Continue IV     fluids. 4. Possible urinary tract infection.  She had a leukocytosis of 19.3.     Continue ceftriaxone.  Urine culture is pending.  She has not been     febrile.  She did not have high urea on her urinalysis, but did     have a positive nitrite and trace positive leukocyte esterase, too     numerous to count rbc's. 5. Hypertension.  Holding antihypertensives except metoprolol was     being continued for now.     Paula Compton. Willey Blade, MD     ROF/MEDQ  D:  04/24/2013  T:  04/24/2013  Job:  [id number]

## 2013-04-25 ENCOUNTER — Ambulatory Visit (HOSPITAL_COMMUNITY): Payer: Medicare Other

## 2013-04-25 LAB — BASIC METABOLIC PANEL
BUN: 23 mg/dL (ref 6–23)
CO2: 26 mEq/L (ref 19–32)
Calcium: 8.9 mg/dL (ref 8.4–10.5)
Chloride: 100 mEq/L (ref 96–112)
Creatinine, Ser: 0.97 mg/dL (ref 0.50–1.10)
GFR, EST AFRICAN AMERICAN: 59 mL/min — AB (ref >90)
GFR, EST NON AFRICAN AMERICAN: 51 mL/min — AB (ref >90)
Glucose, Bld: 90 mg/dL (ref 70–99)
POTASSIUM: 3.9 meq/L (ref 3.7–5.3)
Sodium: 140 mEq/L (ref 137–147)

## 2013-04-25 LAB — URINE CULTURE: Colony Count: >=100000

## 2013-04-25 LAB — CBC
HCT: 30.7 % — ABNORMAL LOW (ref 36.0–46.0)
HEMOGLOBIN: 9.8 g/dL — AB (ref 12.0–15.0)
MCH: 29.1 pg (ref 26.0–34.0)
MCHC: 31.9 g/dL (ref 30.0–36.0)
MCV: 91.1 fL (ref 78.0–100.0)
PLATELETS: 166 10*3/uL (ref 150–400)
RBC: 3.37 MIL/uL — AB (ref 3.87–5.11)
RDW: 15.5 % (ref 11.5–15.5)
WBC: 14.9 10*3/uL — AB (ref 4.0–10.5)

## 2013-04-25 LAB — CANCER ANTIGEN 19-9: CA 19 9: 61872 U/mL — AB (ref <35.0)

## 2013-04-25 LAB — CEA: CEA: 341 ng/mL — AB (ref 0.0–5.0)

## 2013-04-25 MED ORDER — CIPROFLOXACIN HCL 250 MG PO TABS: 250.0000 mg | ORAL_TABLET | Freq: Two times a day (BID) | ORAL | Status: AC

## 2013-04-25 NOTE — Discharge Summary (Signed)
[name], [name]                  ACCOUNT NO.:  [id number]  MEDICAL RECORD NO.:  [id number]  LOCATION:                                 FACILITY:  PHYSICIAN:  Paula Compton. Willey Blade, MD       DATE OF BIRTH:  March 12, 1923  DATE OF ADMISSION:  04/23/2013 DATE OF DISCHARGE:  [facility]                              DISCHARGE SUMMARY   DISCHARGE DIAGNOSES: 1. Metastatic pancreatic carcinoma. 2. Adenocarcinoma of the stomach. 3. Urothelial carcinoma of the left ureter. 4. Gram-negative rod urinary tract infection. 5. Chronic kidney disease, stage 3. 6. Hypertension. 7. History of renal artery stenosis. 8. Normocytic anemia. 9. Dehydration. 10.Severe malnutrition. 11.Gastric carcinoid. 12.Hypothyroidism.  DISCHARGE MEDICATIONS: 1. Cipro 250 mg b.i.d. for 7 days. 2. Levothyroxine 88 mcg daily. 3. Norco 5/325 one q.6 p.r.n.  HOSPITAL COURSE:  This patient is an 80 year old female with a history of stomach carcinoma and left ureteral carcinoma who presented with dehydration, weakness, and anorexia.  She had fallen at home the day before.  She had hit the right side of her head.  CT scan of the head revealed no subdural hematoma or fracture.  CT scan of her neck revealed no fracture.  She was dehydrated with a BUN and creatinine of 46 and 1.43.  She was malnourished with an albumin of 2.9.  She was found to have evidence of UTI on urinalysis with a white count of 19.3.  Her urine culture reveals greater than 100,000 gram-negative rods.  She was treated with ceftriaxone.  She has not had a fever, white count improved to 14.9 before discharge.  Her dehydration responded nicely to IV fluids with a drop in her BUN and creatinine to 23 and 0.97.  She has chronic kidney disease, stage 3.  She has a history of diabetes which has resolved with weight loss.  She has lost over 20 pounds in recent months.  She has not eaten well or eating and oral intake of fluids improved during her hospitalization.  She had  undergone a CT scan of the abdomen 3 days prior to hospitalization, which revealed widely metastatic disease involving the liver, lungs, stomach, and pancreas.  She was seen in consultation by Oncology.  She had a CA 19-9 of [id number].  Her CEA level was elevated at 341, a chromogranin A level is pending.  She also had a history of a gastric carcinoid.  It was felt with this high CA-19-9 that she may very well have metastatic pancreatic carcinoma.  She is being referred to hospice.  She is improved and stable for discharge on the morning of April 25, 2013. She will continue outpatient antibiotic therapy with Cipro pending further culture results.  Her antihypertensives have been discontinued.  Fasting glucose is 90.  The patient will be seen in followup in 1 week.     Paula Compton. Willey Blade, MD     ROF/MEDQ  D:  04/25/2013  T:  04/25/2013  Job:  [id number]

## 2013-04-25 NOTE — Progress Notes (Signed)
Patient and family states understanding of discharge, cipro to be called in to [facility] pahrmacy

## 2013-04-25 NOTE — Progress Notes (Signed)
Subjective: Patient seen laying in bed comfortably eating breakfast.  Thus far, she has eaten 25% of her tray and continues to eat during conversation.  I personally reviewed and went over laboratory results with the patient.  Her renal function has improved, but her TKP:[id number] ratio remains > 20 and therefore she is likely still a little dehydrated.  She continues to receive IV fluids.   Her husband asks for guidance on foods the patient can eat at home and I informed him that she can eat anything she desires.  Any particular diet is not enforced at this time due to quality of life issues.   I suspect the patient will be leaving the hospital today with hospice at home.  She will be best served with Hospice.  She denies any complaints this AM.  Objective: Vital signs in last 24 hours: Temp:  [97.2 F (36.2 C)-97.4 F (36.3 C)] 97.4 F (36.3 C) (01/21 0533) Pulse Rate:  [58-66] 58 (01/21 0533) Resp:  [16-18] 16 (01/20 2246) BP: (114-127)/(44-59) 114/57 mmHg (01/21 0533) SpO2:  [97 %-100 %] 100 % (01/21 0533) Weight:  [99 lb 3.3 oz (45 kg)] 99 lb 3.3 oz (45 kg) (01/21 0544)  Intake/Output from previous day: 01/20 0800 - 01/21 0759 In: 1417.9 [P.O.:780; I.V.:587.9; IV Piggyback:50] Out: -  Intake/Output this shift:    General appearance: alert, cooperative, appears stated age, cachectic and very pleasant  Lab Results:   Recent Labs  04/23/13 1156 04/25/13 0512  WBC 19.3* 14.9*  HGB 11.1* 9.8*  HCT 34.9* 30.7*  PLT 197 166   BMET  Recent Labs  04/23/13 1156 04/25/13 0512  NA 138 140  K 4.5 3.9  CL 92* 100  CO2 24 26  GLUCOSE 104* 90  BUN 46* 23  CREATININE 1.43* 0.97  CALCIUM 9.5 8.9    Studies/Results: Dg Chest 2 View  04/23/2013   CLINICAL DATA:  Hypertension.  EXAM: CHEST  2 VIEW  COMPARISON:  05/19/2010  FINDINGS: Mediastinum and hilar structures are normal. The lungs are clear of acute infiltrates. Changes of COPD and interstitial fibrosis noted. No  pleural effusion or pneumothorax. Heart size normal. No acute bony abnormality. Degenerative changes thoracic spine. Mild lower thoracic spine stable vertebral body compression. Contrast noted in the colon.  IMPRESSION: COPD and pulmonary interstitial fibrosis.  No acute abnormality.   Electronically Signed   By: Marcello Moores  Register   On: 04/23/2013 12:41   Ct Head Wo Contrast  04/23/2013   CLINICAL DATA:  Fall  EXAM: CT HEAD WITHOUT CONTRAST  CT CERVICAL SPINE WITHOUT CONTRAST  TECHNIQUE: Multidetector CT imaging of the head and cervical spine was performed following the standard protocol without intravenous contrast. Multiplanar CT image reconstructions of the cervical spine were also generated.  COMPARISON:  None.  FINDINGS: CT HEAD FINDINGS  Chronic ischemic changes in the right cerebellar hemisphere and periventricular white matter. There is no mass effect, midline shift, or acute intracranial hemorrhage. Cranium is intact. Mastoid air cells and visualized paranasal sinuses are clear.  CT CERVICAL SPINE FINDINGS  No acute fracture or dislocation in the cervical spine.  Multilevel degenerative changes present.  C2-3: Prominent left facet arthropathy.  C3-4: Prominent left facet arthropathy. Posterior osteophytes result in some central stenosis.  C4-5: Right facet arthropathy, right central stenosis, right foraminal narrowing.  C5-6: Posterior osteophytes, right foraminal narrowing. Right central stenosis.  C6-7: Significant right facet arthropathy. Right foraminal narrowing. Mild posterior disc osteophytes.  C7-T1:  Right-sided facet arthropathy.  IMPRESSION:  No acute intracranial pathology.  No significant injury in the cervical spine. Degenerative changes are noted.   Electronically Signed   By: Maryclare Bean M.D.   On: 04/23/2013 12:43   Ct Cervical Spine Wo Contrast  04/23/2013   CLINICAL DATA:  Fall  EXAM: CT HEAD WITHOUT CONTRAST  CT CERVICAL SPINE WITHOUT CONTRAST  TECHNIQUE: Multidetector CT imaging of  the head and cervical spine was performed following the standard protocol without intravenous contrast. Multiplanar CT image reconstructions of the cervical spine were also generated.  COMPARISON:  None.  FINDINGS: CT HEAD FINDINGS  Chronic ischemic changes in the right cerebellar hemisphere and periventricular white matter. There is no mass effect, midline shift, or acute intracranial hemorrhage. Cranium is intact. Mastoid air cells and visualized paranasal sinuses are clear.  CT CERVICAL SPINE FINDINGS  No acute fracture or dislocation in the cervical spine.  Multilevel degenerative changes present.  C2-3: Prominent left facet arthropathy.  C3-4: Prominent left facet arthropathy. Posterior osteophytes result in some central stenosis.  C4-5: Right facet arthropathy, right central stenosis, right foraminal narrowing.  C5-6: Posterior osteophytes, right foraminal narrowing. Right central stenosis.  C6-7: Significant right facet arthropathy. Right foraminal narrowing. Mild posterior disc osteophytes.  C7-T1:  Right-sided facet arthropathy.  IMPRESSION: No acute intracranial pathology.  No significant injury in the cervical spine. Degenerative changes are noted.   Electronically Signed   By: Maryclare Bean M.D.   On: 04/23/2013 12:43    Medications: I have reviewed the patient's current medications.  Assessment/Plan: 1. 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   Plan is for patient to go home with hospice.  Patient declined any biopsies and intensive intervention which is absolutely reasonable. 2. Low-grade papillary urothelial carcinoma, followed by Dr. [name] Pollock.  3. Neuroendocrine tumor of stomach, biopsied by Dr. Laural Golden  4. Adenocarcinoma of stomach, biopsied by Dr. Laural Golden  5. Cachexia  6. Failure to thrive  7. Weight loss  8. Decreased appetite 9. Improving renal function with  MHD:[id number] ratio remaining >20 indicative of mild dehydration. 10. Elevated tumor markers:  A. CA 19-9- 11,941  B. CEA- 341  C. Chromogranin A- pending 11.  Oncology will sign off at time of discharge from the [facility].  We would be glad to see her in the [facility] if needed.    LOS: 2 days    KEFALAS,THOMAS 04/25/2013

## 2013-04-26 ENCOUNTER — Encounter (HOSPITAL_COMMUNITY): Admission: RE | Payer: Self-pay | Source: Ambulatory Visit

## 2013-04-26 ENCOUNTER — Ambulatory Visit (HOSPITAL_COMMUNITY): Admission: RE | Admit: 2013-04-26 | Payer: Medicare Other | Source: Ambulatory Visit | Admitting: Internal Medicine

## 2013-04-26 SURGERY — EGD (ESOPHAGOGASTRODUODENOSCOPY)
Anesthesia: Moderate Sedation

## 2013-04-28 LAB — CULTURE, BLOOD (ROUTINE X 2)
Culture: NO GROWTH
Culture: NO GROWTH

## 2013-05-01 LAB — CHROMOGRANIN A: CHROMOGRANIN A: 242 ng/mL — AB (ref 1.9–15.0)

## 2013-05-14 ENCOUNTER — Telehealth: Payer: Self-pay

## 2013-05-14 NOTE — Telephone Encounter (Signed)
Patient past away @ Home per Obituary in [place] [employer] °

## 2013-06-03 DEATH — deceased

## 2014-11-24 IMAGING — CR DG CHEST 2V
2 series · 2 of 2 positions shown · non-contrast
Comparison: 05/19/2010

CLINICAL DATA: Hypertension.

EXAM:
CHEST  2 VIEW

[view not recorded (1 of 2)]
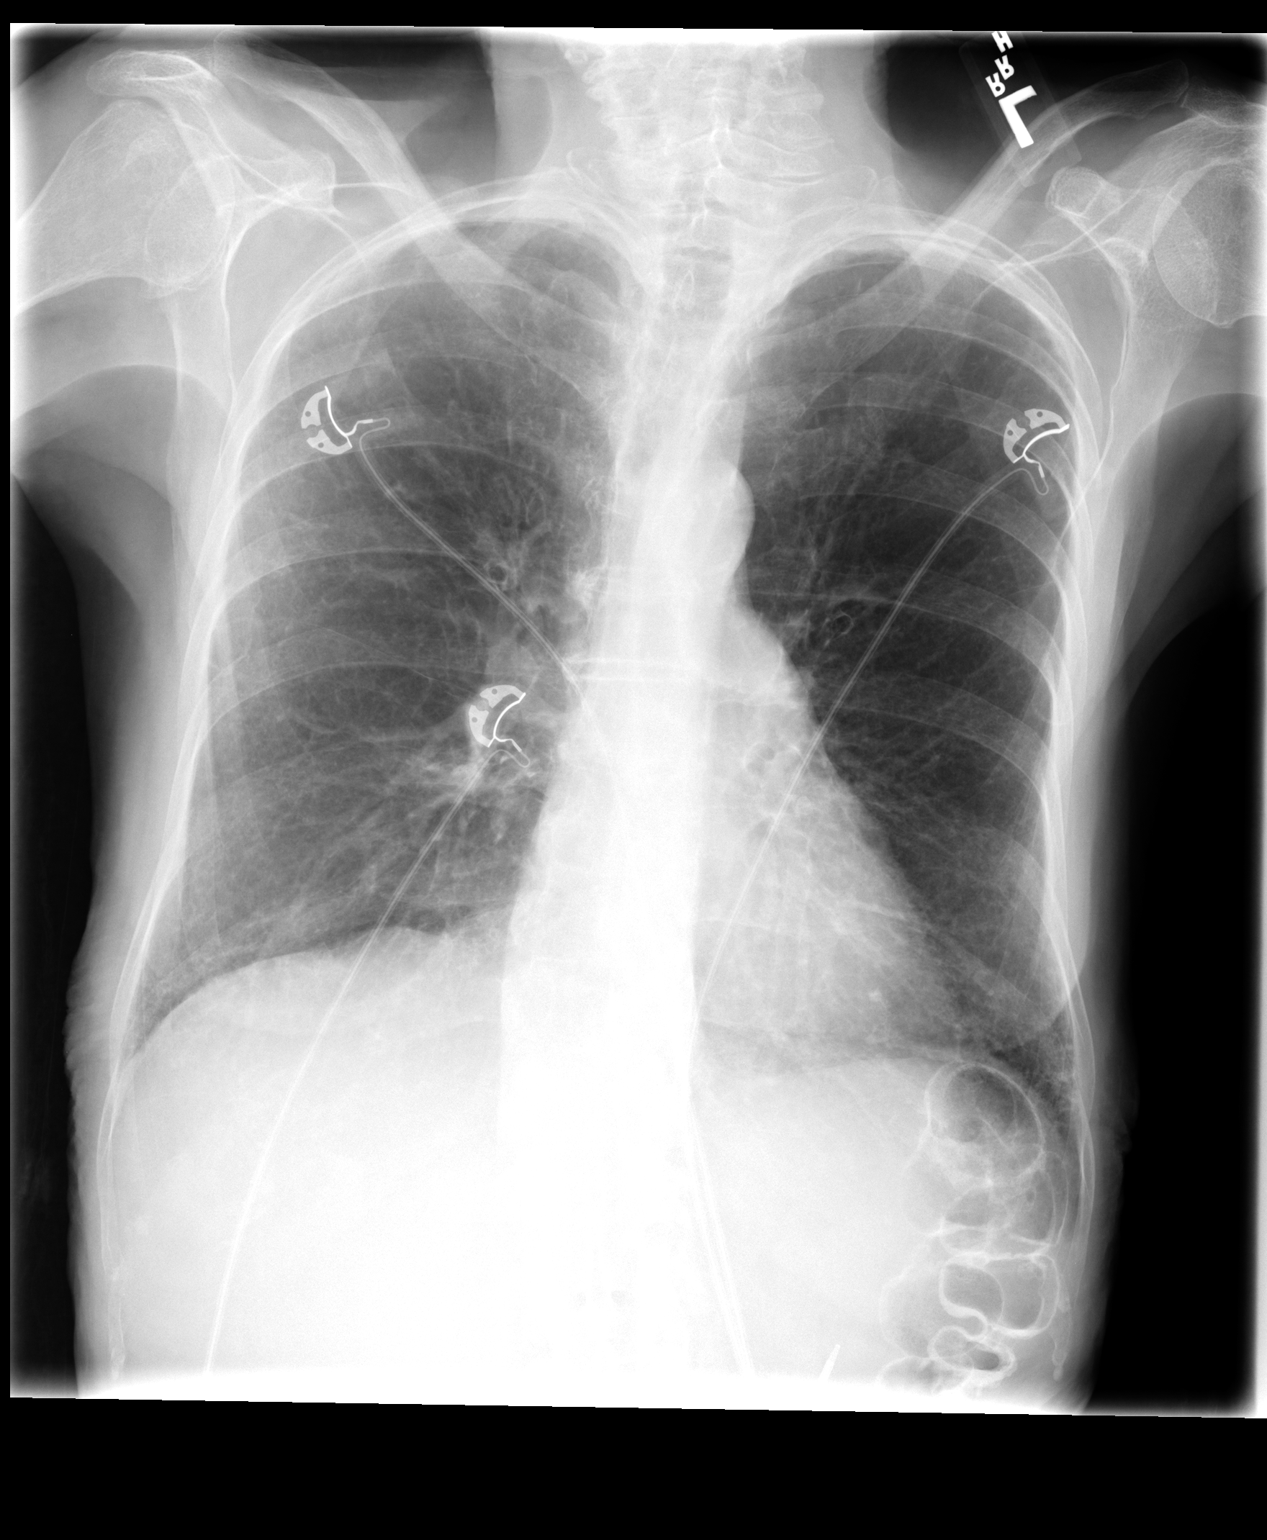

[view not recorded (2 of 2)]
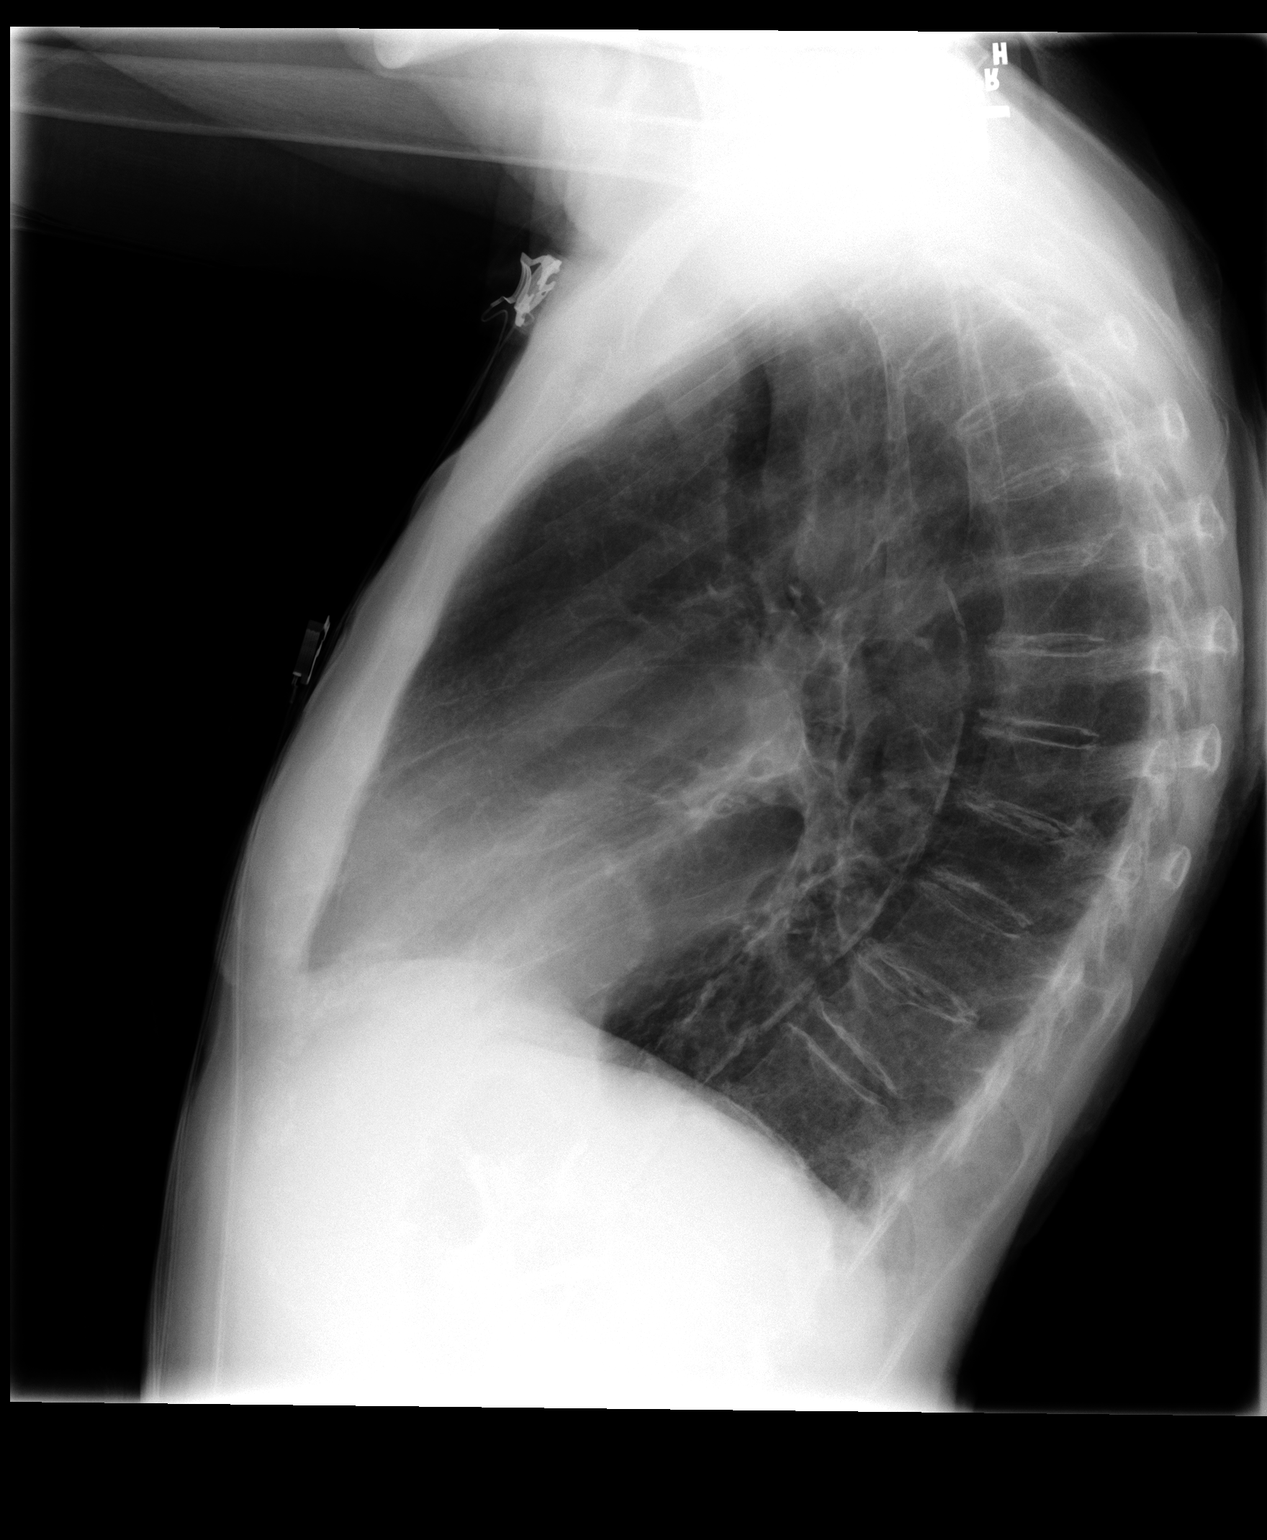

[2 of 2 positions shown; findings below may reference images not displayed]

FINDINGS: Mediastinum and hilar structures are normal. The lungs are clear of
acute infiltrates. Changes of COPD and interstitial fibrosis noted.
No pleural effusion or pneumothorax. Heart size normal. No acute
bony abnormality. Degenerative changes thoracic spine. Mild lower
thoracic spine stable vertebral body compression. Contrast noted in
the colon.
IMPRESSION: COPD and pulmonary interstitial fibrosis.  No acute abnormality.

## 2014-11-24 IMAGING — CT CT HEAD W/O CM
4 of 5 series · 17 of 47 positions shown, 18 images · non-contrast
Comparison: None.

CLINICAL DATA: Fall

EXAM:
CT HEAD WITHOUT CONTRAST
CT CERVICAL SPINE WITHOUT CONTRAST
TECHNIQUE: Multidetector CT imaging of the head and cervical spine was
performed following the standard protocol without intravenous
contrast. Multiplanar CT image reconstructions of the cervical spine
were also generated.

[Series 2: headseq 4.8 h37s · axial · 0.41mm/px · z∈[+212,+316]mm · 4 of 36 slices shown, 5 images]
[im 8/36  brain]
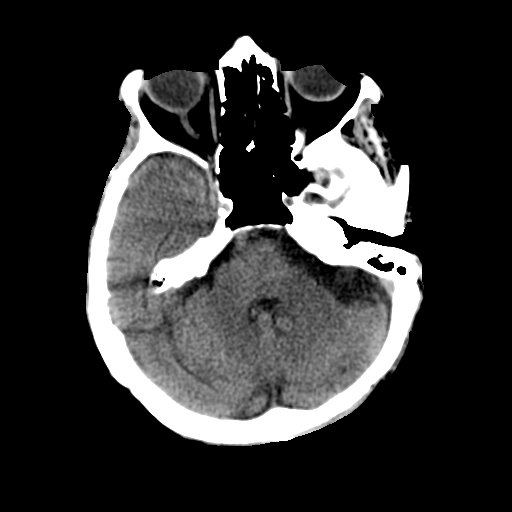
[im 8/36  bone]
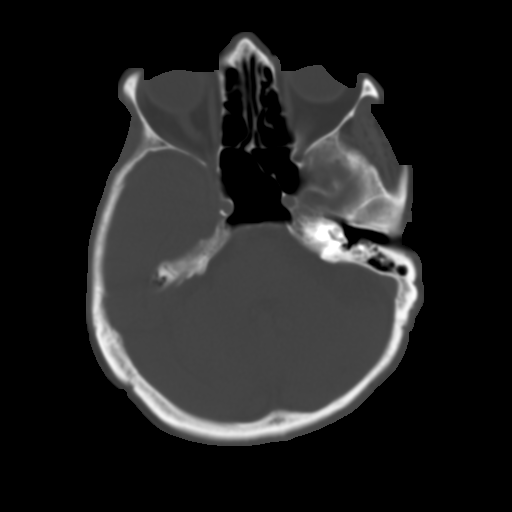
[im 15/36  brain]
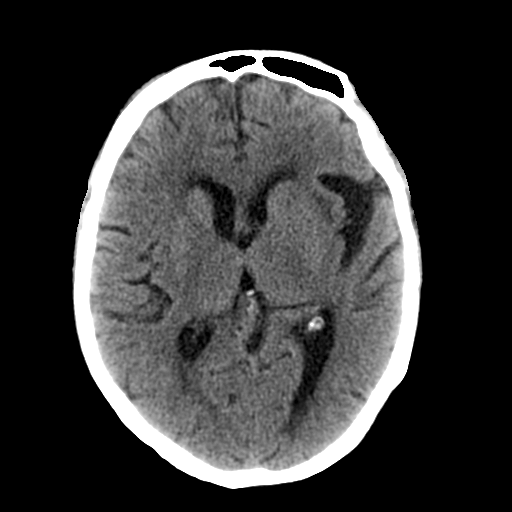
[im 22/36  brain]
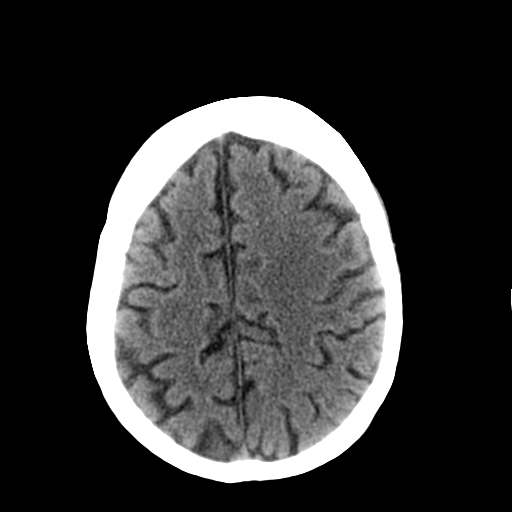
[im 29/36  brain]
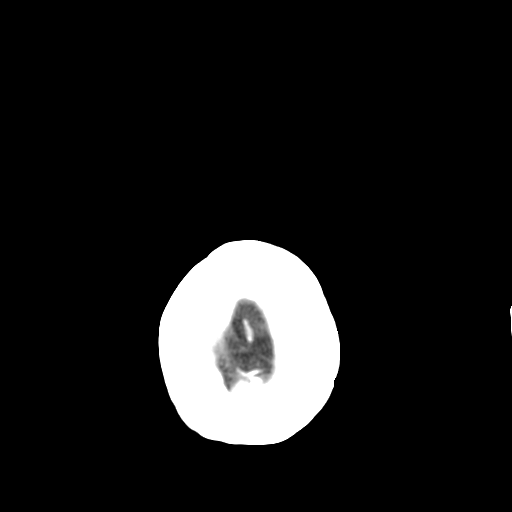

[Series 7: sagittal bone 2.0 · sagittal · 0.28mm/px · 3 of 59 slices shown]
[im 20/59  brain]
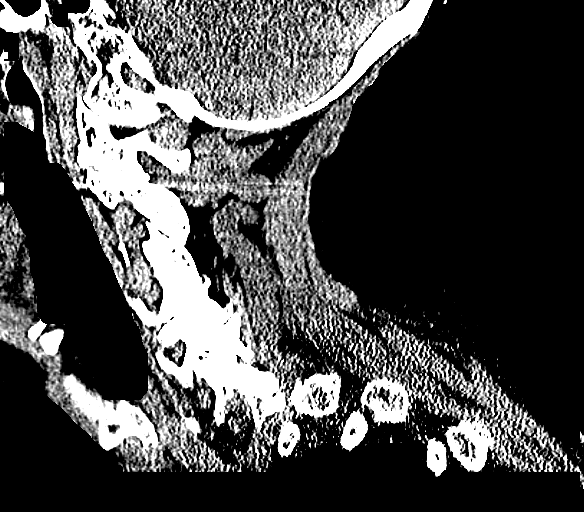
[im 30/59  brain]
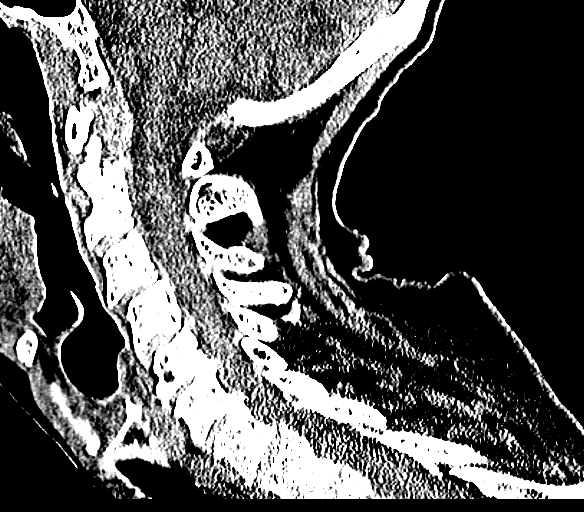
[im 39/59  brain]
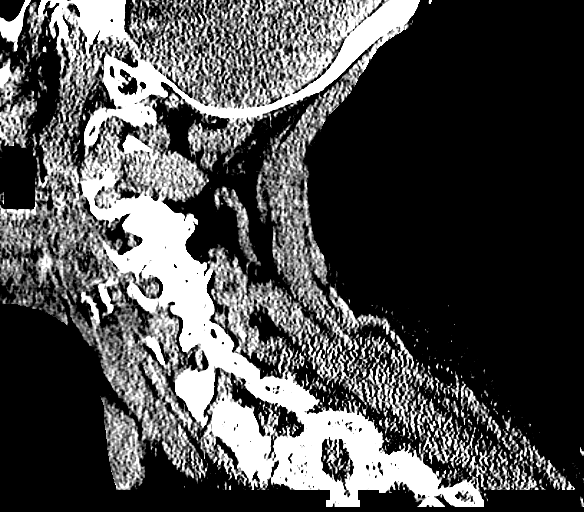

[Series 8: coronal bone 2.0 · coronal · 0.28mm/px · 3 of 52 slices shown]
[im 18/52  brain]
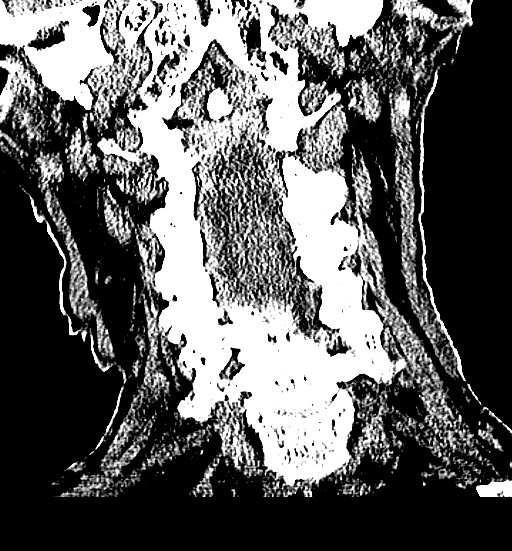
[im 23/52  brain]
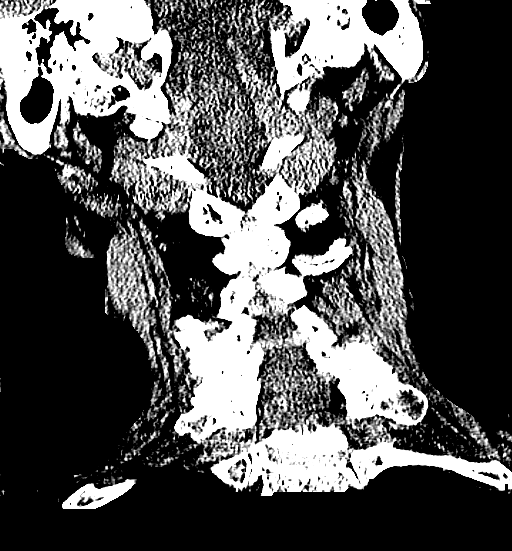
[im 29/52  brain]
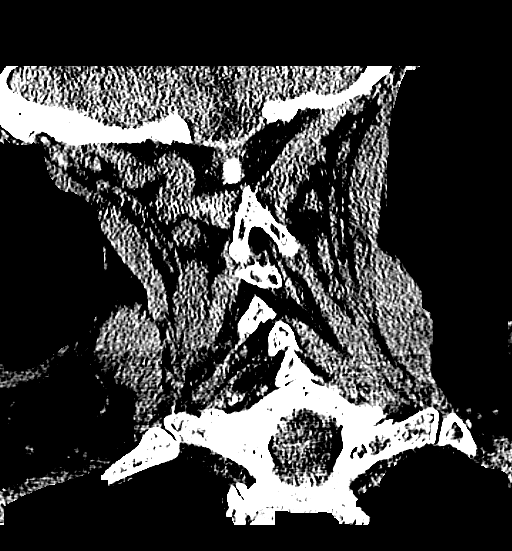

[Series 9: axial bone 2.0 · axial · 0.21mm/px · z∈[+23,+128]mm · 7 of 86 slices shown]
[im 8/86  bone]
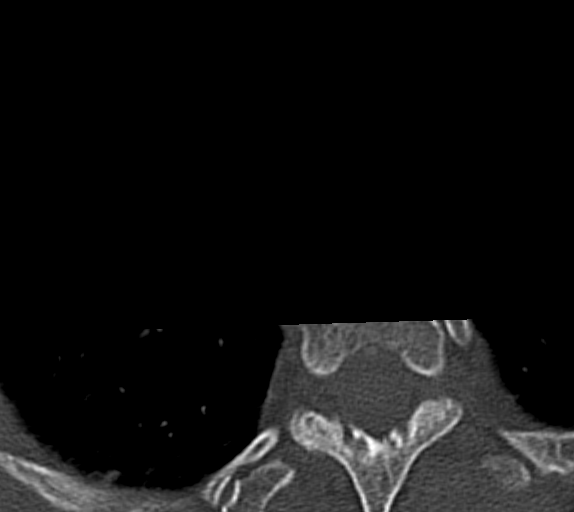
[im 22/86  bone]
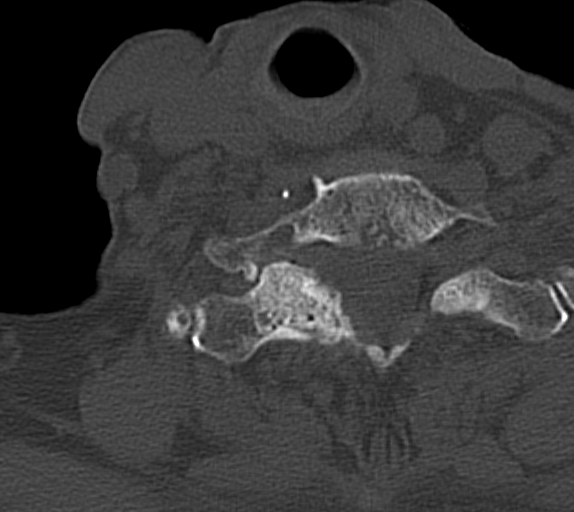
[im 29/86  bone]
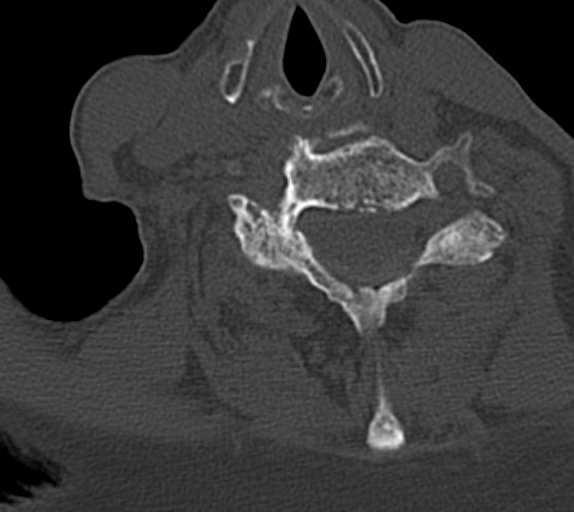
[im 36/86  bone]
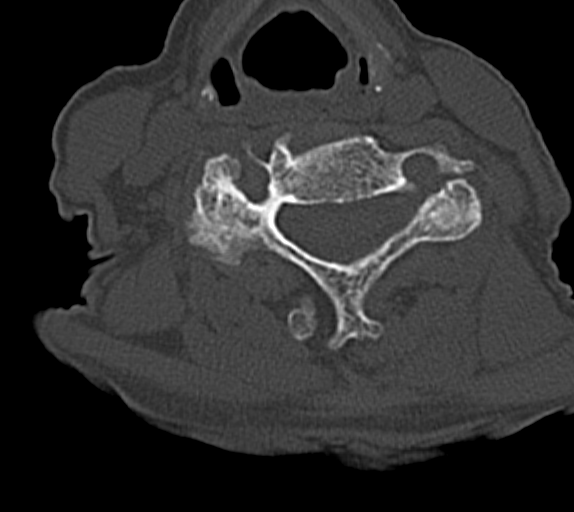
[im 50/86  bone]
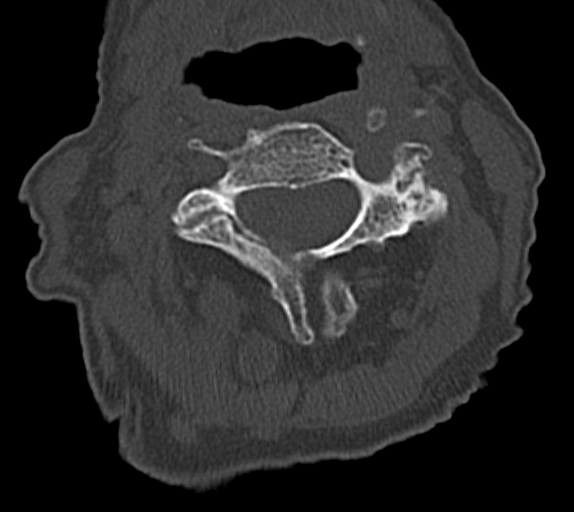
[im 57/86  bone]
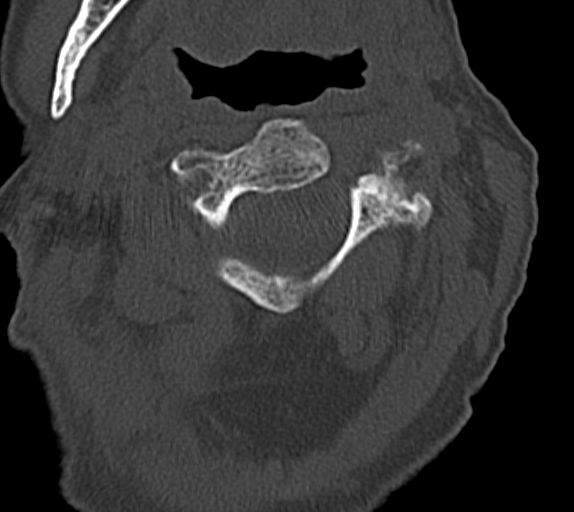
[im 64/86  bone]
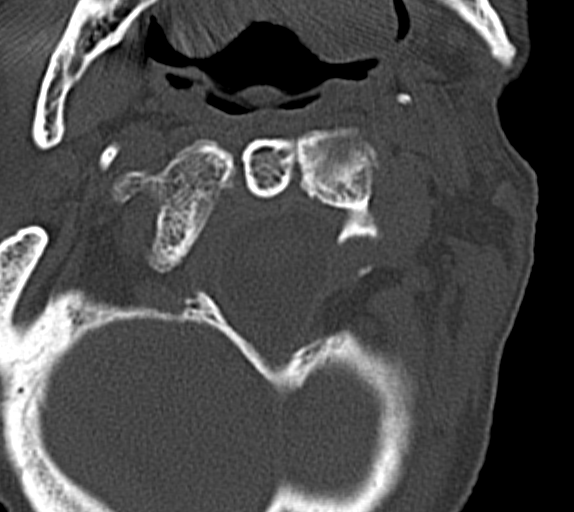

[17 of 47 positions shown; findings below may reference images not displayed]

FINDINGS: CT HEAD FINDINGS

Chronic ischemic changes in the right cerebellar hemisphere and
periventricular white matter. There is no mass effect, midline
shift, or acute intracranial hemorrhage. Cranium is intact. Mastoid
air cells and visualized paranasal sinuses are clear.

CT CERVICAL SPINE FINDINGS

No acute fracture or dislocation in the cervical spine.

Multilevel degenerative changes present.

C2-3: Prominent left facet arthropathy.

C3-4: Prominent left facet arthropathy. Posterior osteophytes result
in some central stenosis.

C4-5: Right facet arthropathy, right central stenosis, right
foraminal narrowing.

C5-6: Posterior osteophytes, right foraminal narrowing. Right
central stenosis.

C6-7: Significant right facet arthropathy. Right foraminal
narrowing. Mild posterior disc osteophytes.

C7-T1:  Right-sided facet arthropathy.
IMPRESSION: No acute intracranial pathology.

No significant injury in the cervical spine. Degenerative changes
are noted.
